# Patient Record
Sex: Male | Born: 1975 | Race: White | Hispanic: No | Marital: Married
Health system: Southern US, Community
[De-identification: ages and names within clinical notes are randomized; demographics above are authoritative.]

## PROBLEM LIST (undated history)

## (undated) DIAGNOSIS — L700 Acne vulgaris: Secondary | ICD-10-CM

## (undated) HISTORY — DX: Acne vulgaris: L70.0

---

## 2005-01-28 ENCOUNTER — Emergency Department (HOSPITAL_COMMUNITY): Admission: EM | Admit: 2005-01-28 | Discharge: 2005-01-28 | Payer: Self-pay | Admitting: Emergency Medicine

## 2005-01-30 ENCOUNTER — Ambulatory Visit (HOSPITAL_COMMUNITY): Admission: RE | Admit: 2005-01-30 | Discharge: 2005-01-30 | Payer: Self-pay | Admitting: Orthopedic Surgery

## 2005-07-02 ENCOUNTER — Emergency Department (HOSPITAL_COMMUNITY): Admission: EM | Admit: 2005-07-02 | Discharge: 2005-07-02 | Payer: Self-pay | Admitting: Family Medicine

## 2009-11-15 ENCOUNTER — Inpatient Hospital Stay (HOSPITAL_COMMUNITY): Admission: EM | Admit: 2009-11-15 | Discharge: 2009-11-17 | Payer: Self-pay | Admitting: Emergency Medicine

## 2011-02-22 LAB — ETHANOL: Alcohol, Ethyl (B): <5 mg/dL (ref 0–10)

## 2011-02-22 LAB — BASIC METABOLIC PANEL
BUN: 11 mg/dL (ref 6–23)
CO2: 25 mEq/L (ref 19–32)
Calcium: 8.9 mg/dL (ref 8.4–10.5)
Glucose, Bld: 93 mg/dL (ref 70–99)
Sodium: 138 mEq/L (ref 135–145)

## 2011-02-22 LAB — DIFFERENTIAL
Basophils Absolute: 0 10*3/uL (ref 0.0–0.1)
Eosinophils Relative: 1 % (ref 0–5)
Lymphocytes Relative: 34 % (ref 12–46)
Lymphs Abs: 3.7 10*3/uL (ref 0.7–4.0)
Neutro Abs: 6.4 10*3/uL (ref 1.7–7.7)
Neutrophils Relative %: 59 % (ref 43–77)

## 2011-02-22 LAB — CBC
Hemoglobin: 13.2 g/dL (ref 13.0–17.0)
MCHC: 34.2 g/dL (ref 30.0–36.0)
Platelets: 169 10*3/uL (ref 150–400)
Platelets: 212 10*3/uL (ref 150–400)
RDW: 12.4 % (ref 11.5–15.5)
RDW: 12.6 % (ref 11.5–15.5)
WBC: 10.9 10*3/uL — ABNORMAL HIGH (ref 4.0–10.5)

## 2011-02-22 LAB — TYPE AND SCREEN
ABO/RH(D): O NEG
Antibody Screen: NEGATIVE

## 2011-02-22 LAB — PROTIME-INR
INR: 1.04 (ref 0.00–1.49)
Prothrombin Time: 13.5 seconds (ref 11.6–15.2)

## 2011-02-22 LAB — HEMOGLOBIN AND HEMATOCRIT, BLOOD
HCT: 36.3 % — ABNORMAL LOW (ref 39.0–52.0)
HCT: 36.7 % — ABNORMAL LOW (ref 39.0–52.0)

## 2011-04-09 NOTE — Consult Note (Signed)
NAMEMAREON, ROBINETTE                ACCOUNT NO.:  [id number]   MEDICAL RECORD NO.:  [id number]          PATIENT TYPE:  EMS   LOCATION:  MAJO                         FACILITY:  [facility]   PHYSICIAN:  Dionne Ano. Gramig III, M.D.DATE OF BIRTH:  11-Aug-1976   DATE OF CONSULTATION:  01/28/2005  DATE OF DISCHARGE:                                   CONSULTATION   REFERRING PHYSICIAN:  Dr. Joen Laura.   REASON FOR CONSULTATION:  I had the pleasure to see Princella Pellegrini today in  the [facility] Emergency Room following the kind referral from Dr. Joen Laura.  This patient was injured on the job when a table saw injured his  left middle finger.  He has a partial amputation about the middle finger.  He denies other injury today.   ALLERGIES:  None.   MEDICINES:  None.   PAST MEDICAL HISTORY:  None.   PAST SURGICAL HISTORY:  None.   SOCIAL HISTORY:  He does not smoke or drink.  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   PHYSICAL EXAMINATION:  GENERAL:  He is alert and oriented.  EXTREMITIES:  On examination, the patient has a left middle finger partial  amputation.  There is no gross bony exposure.  There are no signs of  infection or dystrophy.  He has no obvious tendon injury.  His nail bed is  intact and nail plate is intact.  I reviewed this at length and his  findings.   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   PLAN:  I have gone ahead and performed I&D.  He was under a block anesthetic  and this area was probed.  There was no gross bony periosteum exposed and  thus we I&D'ed the skin and subcutaneous tissue, placed a sterile dressing  on this and following this, I bandaged him.  I have discharged him on Keflex  500 mg one p.o. daily.  I have given him a tetanus shot, 1 g of Ancef IM and  in addition to that, I discussed with him Keflex 500 mg 4 times  daily,  elevation and other precautions.  We will be calling him to set up elective  skin grafting and I&D at 36-48 hours from today's date.  His n.p.o. status  is unfavorable for operative intervention at present time.  I have discussed  __________ , etc.  He will be out of work until further notice.  All  questions have been encouraged and answered.     WMG/MEDQ  D:  01/28/2005  T:  01/29/2005  Job:  [id number]

## 2011-04-09 NOTE — Op Note (Signed)
NAMEFISHER, Aaron [name]                ACCOUNT NO.:  [id number]   MEDICAL RECORD NO.:  [id number]          PATIENT TYPE:  OIB   LOCATION:  2899                         FACILITY:  [facility]   PHYSICIAN:  Dionne Ano. Gramig III, M.D.DATE OF BIRTH:  1976-02-21   DATE OF PROCEDURE:  01/30/2005  DATE OF DISCHARGE:  01/30/2005                                 OPERATIVE REPORT   PREOPERATIVE DIAGNOSIS:  Partial amputation of left middle finger with  exposed subcu and a 4 x 2 cm loss of tissue.   POSTOPERATIVE DIAGNOSIS:  Partial amputation of left middle finger with  exposed subcu and a 4 x 2 cm loss of tissue.   PROCEDURES:  1.  I&D of left middle finger skin and subcutaneous tissue in preparation      for a skin grafting, left middle finger.  2.  Full thickness skin graft, 4 x 2 cm, left middle finger volar defect;      harvest site from the left forearm.  3.  Stress radiography.   SURGEON:  Dionne Ano. Amanda Pea, MD.   ASSISTANT:  None.   COMPLICATIONS:  None.   ANESTHESIA:  General.   INDICATIONS FOR PROCEDURE:  The patient is a 35 year old male, who sustained  an on-the-job injury.  He previously had I&D 36 hours and presents today for  definitive care.  I have counseled him in regards to risks and benefits of  surgery and he desires to proceed with the above-mentioned operative  intervention.  All questions have been encouraged and answered.   OPERATION IN DETAIL:  The patient was taken to the operative suite and  underwent a smooth induction of general anesthesia.  He was laid supine,  appropriately padded, and prepped and draped in the usual sterile fashion  with Betadine scrub and paint.  Following this and securing a sterile field,  the patient had the left middle finger prepared.  I prepared a skin grafting  site.  He had a stress radiography brought in, which revealed that there was  no exposed bone.  I placed this in the alignment of the distal phalanx.  Once stress radiography  was performed, I then performed I&D of skin and  subcutaneous tissue and prepared the area for skin grafting.  Greater than  three liters of solution were lavaged through the area to cleanse it.  Following this, I harvested a 4 x 2 cm full thickness skin graft from the  left volar forearm.  This was closed primarily with a Prolene suture.  Once  the full thickness skin graft was harvested, I defatted it and made a few  perforations in it, and then inset it into the  middle finger after  hemostasis was secured.  It was inset with a rim of 5-0 chromic and 4-0  Prolene sutures.  The 4-0 Prolene was tied over a bolster dressing.  Xeroform was placed followed by a cotton bolster dressing soaked in mineral  oil and saline.  This looked excellent and I was pleased with the findings  and the serrations in the skin graft allowed for brideability of  the  graft,in my opinion.  Following this, Marcaine was placed in the finger for  postop analgesia, approximately 8 mL, and the patient had a field block  placed about the volar forearm for purposes of analgesia postoperatively.  I  was pleased with this and the findings.  I then performed a cleansing of the  wound with water.  A sterile dressing and a long, volar, plaster splint was  applied without difficulty.  All sponge and instrument counts were reported  as correct.  He was taken to the recovery room and given an additional gram  of Ancef.  He will be monitored there and discharged home on Ultracet for  pain and continued antibiotics, which he has previously been on.  I have  discussed with him and his family __________, etc.  I will see him back in  the office in eight days.  All questions have been encouraged and answered.      WMG/MEDQ  D:  01/30/2005  T:  01/31/2005  Job:  [id number]

## 2012-08-30 ENCOUNTER — Other Ambulatory Visit: Payer: Self-pay | Admitting: Emergency Medicine

## 2012-08-30 NOTE — Telephone Encounter (Signed)
Chart pulled to PA pool at nurses station DOS 08/28/11

## 2012-09-07 ENCOUNTER — Ambulatory Visit (INDEPENDENT_AMBULATORY_CARE_PROVIDER_SITE_OTHER): Payer: BC Managed Care – PPO | Admitting: Physician Assistant

## 2012-09-07 VITALS — BP 118/88 | HR 72 | Temp 98.0°F | Resp 16 | Ht 73.25 in | Wt 198.2 lb

## 2012-09-07 DIAGNOSIS — L989 Disorder of the skin and subcutaneous tissue, unspecified: Secondary | ICD-10-CM

## 2012-09-07 DIAGNOSIS — B353 Tinea pedis: Secondary | ICD-10-CM

## 2012-09-07 LAB — POCT SKIN KOH: Skin KOH, POC: POSITIVE

## 2012-09-07 MED ORDER — KETOCONAZOLE 2 % EX CREA: TOPICAL_CREAM | Freq: Every day | CUTANEOUS | Status: DC

## 2012-09-07 NOTE — Progress Notes (Signed)
  Subjective:    Patient ID: Aaron [name], male    DOB: 04-Dec-1975, 36 y.o.   MRN: [id number]  HPI  Aaron [name] is a 36 yr old male with complaints of a one week history of a lesion on the third toe of the left foot.  There is some scaling and redness around the toe nail.  It is not painful, but is somewhat itchy.  He has not injured the toe that he is aware of.  No other systemic symptoms.    Review of Systems  All other systems reviewed and are negative.       Objective:   Physical Exam  Constitutional: He is oriented to person, place, and time. He appears well-developed and well-nourished. No distress.  Neurological: He is alert and oriented to person, place, and time.  Skin: Skin is warm and dry.       Lesion of the third digit of the left foot; advancing erythematous border with some scaling; lesion surrounds the nail but the nail appears unaffected   Filed Vitals:   09/07/12 1036  BP: 118/88  Pulse: 72  Temp: 98 F (36.7 C)  Resp: 16    Results for orders placed in visit on 09/07/12  POCT SKIN KOH      Component Value Range   Skin KOH, POC Positive            Assessment & Plan:   1. Tinea pedis  ketoconazole (NIZORAL) 2 % cream  2. Skin lesion of left lower limb  POCT Skin KOH   Aaron [name] is a 36 yr old male with tinea pedis.  Will treat with ketoconazole cream.  He will RTC if worsening or not improving.   Agree with the above assessment and plan.  Eula Listen, PA-C 09/07/2012 11:48 AM

## 2012-10-03 ENCOUNTER — Encounter: Payer: Self-pay | Admitting: Medical

## 2012-10-03 ENCOUNTER — Ambulatory Visit (INDEPENDENT_AMBULATORY_CARE_PROVIDER_SITE_OTHER): Payer: BC Managed Care – PPO | Admitting: Medical

## 2012-10-03 VITALS — BP 102/80 | HR 78 | Temp 97.8°F | Resp 16 | Ht 73.5 in | Wt 194.0 lb

## 2012-10-03 DIAGNOSIS — L918 Other hypertrophic disorders of the skin: Secondary | ICD-10-CM

## 2012-10-03 DIAGNOSIS — L909 Atrophic disorder of skin, unspecified: Secondary | ICD-10-CM

## 2012-10-03 DIAGNOSIS — R21 Rash and other nonspecific skin eruption: Secondary | ICD-10-CM

## 2012-10-03 MED ORDER — CLOTRIMAZOLE-BETAMETHASONE 1-0.05 % EX CREA: TOPICAL_CREAM | Freq: Two times a day (BID) | CUTANEOUS | Status: DC

## 2012-10-03 NOTE — Progress Notes (Signed)
Subjective: Here as a new patient today.  Here for c/o rash under both arms, worse L>R.  Had rash of toe recently, went to urgent care, was prescribed antifungal cream which cleared up the toenail rash, but 2 wk ago started getting rash under both arms, quite itchy.  Denies new deodorant, denies new cologne.  No prior similar.  No contacts with similar . No hx/o eczema or psoriasis.  He used the antifungal cream for 5 days, then used OTC hydrocortisone cream about 4 days.  Both creams helped the itch, but neither cleared this up completely.  He does see Dr. Cephus Slater dermatology for cystic acne.    Past Medical History  Diagnosis Date  . Cystic acne    ROS as in HPI  Objective: Gen: wd, wn, nad Skin: left axilla with 4-5 patches of roundish erythematous lesions, 2-3cm diameter each with somewhat raised border, slightly clearing center.  No scaling.  Right axilla with similar but smaller less defined lesions.   Left upper lateral chest wall with pedunculated 8mm x 2mm flap like lesion, skin tag vs verrucal lesion, pink with some erythema of base  Assessment: Encounter Diagnoses  Name Primary?  . Rash Yes  . Skin tag    Plan: Rash - suggestive or ringworm.  KOH prep with minimal hyphae.  Trial of Lotrisone cream.  If not improving in 1wk, consider biopsy.  Skin tag - if this bothers him or c/t to change colors/appear inflamed, then return for excision.  He declines flu vaccine today.

## 2012-11-23 ENCOUNTER — Encounter: Payer: Self-pay | Admitting: Family Medicine

## 2012-11-23 ENCOUNTER — Ambulatory Visit (INDEPENDENT_AMBULATORY_CARE_PROVIDER_SITE_OTHER): Payer: BC Managed Care – PPO | Admitting: Family Medicine

## 2012-11-23 VITALS — BP 130/90 | HR 88 | Wt 190.0 lb

## 2012-11-23 DIAGNOSIS — L708 Other acne: Secondary | ICD-10-CM

## 2012-11-23 DIAGNOSIS — L700 Acne vulgaris: Secondary | ICD-10-CM | POA: Insufficient documentation

## 2012-11-23 DIAGNOSIS — B009 Herpesviral infection, unspecified: Secondary | ICD-10-CM

## 2012-11-23 DIAGNOSIS — B001 Herpesviral vesicular dermatitis: Secondary | ICD-10-CM

## 2012-11-23 MED ORDER — MINOCYCLINE HCL 50 MG PO CAPS: 50.0000 mg | ORAL_CAPSULE | Freq: Two times a day (BID) | ORAL | Status: DC

## 2012-11-23 MED ORDER — VALACYCLOVIR HCL 1 G PO TABS: ORAL_TABLET | ORAL | Status: DC

## 2012-11-23 NOTE — Progress Notes (Signed)
  Subjective:    Patient ID: Aaron [name], male    DOB: 1976/07/18, 37 y.o.   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 He does respond quite nicely to Valtrex. He would like a renewal in his medication. He also has cystic acne and has been seen by Dr. Jorja Loa in the past. He is doing quite nicely on minocycline and would like this refilled.   Review of Systems     Objective:   Physical Exam Alert and in no distress. Exam of his face does show scarring from acne. He also has a healing lesion present on his mid lower lip.      Assessment & Plan:   1. Herpes labialis  valACYclovir (VALTREX) 1000 MG tablet  2. Acne cystica  minocycline (MINOCIN,DYNACIN) 50 MG capsule   discussed they'll checks for prevention however he would prefer to treat this episodically.

## 2013-01-11 ENCOUNTER — Encounter: Payer: Self-pay | Admitting: Family Medicine

## 2013-01-11 ENCOUNTER — Ambulatory Visit (INDEPENDENT_AMBULATORY_CARE_PROVIDER_SITE_OTHER): Payer: BC Managed Care – PPO | Admitting: Family Medicine

## 2013-01-11 VITALS — BP 120/80 | HR 80 | Wt 195.0 lb

## 2013-01-11 DIAGNOSIS — M549 Dorsalgia, unspecified: Secondary | ICD-10-CM

## 2013-01-11 MED ORDER — HYDROCODONE-ACETAMINOPHEN 5-500 MG PO TABS: 1.0000 | ORAL_TABLET | Freq: Three times a day (TID) | ORAL | Status: DC | PRN

## 2013-01-11 NOTE — Patient Instructions (Signed)
800 mg of Advil 3 times per day. Heat to your back for 20 minutes 3 times per day. Use pain medicine mainly at night she can't use it while driving or working. I do not want you on bedrest . Move around as much as you can

## 2013-01-11 NOTE — Progress Notes (Signed)
  Subjective:    Patient ID: Aaron [name], male    DOB: 1976/02/03, 37 y.o.   MRN: [id number]  HPI He complains of a five-day history of aching sensation in his back. No numbness, tingling or weakness but he does have some radiation down his right leg. No bowel or bladder trouble. He did see a chiropractor but states he is not getting any better. He has tried some OTC pain meds without much success.   Review of Systems     Objective:   Physical Exam Exam of his back shows normal lumbar curve with good motion. No tenderness palpation. Pain on forward flexion. Strength is normal. DTRs normal. Straight leg raising positive at 45 however sciatic stretch was negative.       Assessment & Plan:  Back pain - Plan: HYDROcodone-acetaminophen (VICODIN) 5-500 MG per tablet is to use heat 20 minutes 3 times per day. Also recommend 800 mg ibuprofen 3 times per day. Recheck here in one week.

## 2013-01-17 ENCOUNTER — Ambulatory Visit: Payer: BC Managed Care – PPO | Admitting: Family Medicine

## 2013-08-25 ENCOUNTER — Ambulatory Visit (INDEPENDENT_AMBULATORY_CARE_PROVIDER_SITE_OTHER): Payer: BC Managed Care – PPO | Admitting: Family Medicine

## 2013-08-25 ENCOUNTER — Ambulatory Visit: Payer: BC Managed Care – PPO

## 2013-08-25 VITALS — BP 122/78 | HR 84 | Temp 98.6°F | Resp 16 | Ht 75.0 in | Wt 177.0 lb

## 2013-08-25 DIAGNOSIS — R079 Chest pain, unspecified: Secondary | ICD-10-CM

## 2013-08-25 DIAGNOSIS — S20211A Contusion of right front wall of thorax, initial encounter: Secondary | ICD-10-CM

## 2013-08-25 DIAGNOSIS — R0781 Pleurodynia: Secondary | ICD-10-CM

## 2013-08-25 DIAGNOSIS — H1130 Conjunctival hemorrhage, unspecified eye: Secondary | ICD-10-CM

## 2013-08-25 DIAGNOSIS — S20219A Contusion of unspecified front wall of thorax, initial encounter: Secondary | ICD-10-CM

## 2013-08-25 DIAGNOSIS — H1132 Conjunctival hemorrhage, left eye: Secondary | ICD-10-CM

## 2013-08-25 MED ORDER — HYDROCODONE-ACETAMINOPHEN 5-325 MG PO TABS: 1.0000 | ORAL_TABLET | Freq: Four times a day (QID) | ORAL | Status: DC | PRN
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
## 2013-08-25 NOTE — Patient Instructions (Signed)
Ibuprofen up to 800mg  every 8 hours as needed with food during the day, can take hydrocodone every 6 hours as needed for pain.  Deep breaths throughout the day. Return to the clinic or go to the nearest emergency room if any of your symptoms worsen or new symptoms occur.   Subconjunctival Hemorrhage A subconjunctival hemorrhage is a bright red patch covering a portion of the white of the eye. The white part of the eye is called the sclera, and it is covered by a thin membrane called the conjunctiva. This membrane is clear, except for tiny blood vessels that you can see with the naked eye. When your eye is irritated or inflamed and becomes red, it is because the vessels in the conjunctiva are swollen. Sometimes, a blood vessel in the conjunctiva can break and bleed. When this occurs, the blood builds up between the conjunctiva and the sclera, and spreads out to create a red area. The red spot may be very small at first. It may then spread to cover a larger part of the surface of the eye, or even all of the visible white part of the eye. In almost all cases, the blood will go away and the eye will become white again. Before completely dissolving, however, the red area may spread. It may also become brownish-yellow in color, before going away. If a lot of blood collects under the conjunctiva, it may look like a bulge on the surface of the eye. This looks scary, but it will also eventually flatten out and go away. Subconjunctival hemorrhages do not cause pain, but if swollen, may cause a feeling of irritation. There is no effect on vision.  CAUSES   The most common cause is mild trauma (rubbing the eye, irritation).  Subconjunctival hemorrhages can happen because of coughing or straining (lifting heavy objects), vomiting, or sneezing.  In some cases, your doctor may want to check your blood pressure. High blood pressure can also cause a sunconjunctival hemorrhage.  Severe trauma or blunt  injuries.  Diseases that affect blood clotting (hemophilia, leukemia).  Abnormalities of blood vessels behind the eye (carotid cavernous sinus fistula).  Tumors behind the eye.  Certain drugs (aspirin, coumadin, heparin).  Recent eye surgery. HOME CARE INSTRUCTIONS   Do not worry about the appearance of your eye. You may continue your usual activities.  Often, follow-up is not necessary. SEEK MEDICAL CARE IF:   Your eye becomes painful.  The bleeding does not disappear within 3 weeks.  Bleeding occurs elsewhere, for example, under the skin, in the mouth, or in the other eye.  You have recurring subconjunctival hemorrhages. SEEK IMMEDIATE MEDICAL CARE IF:   Your vision changes or you have difficulty seeing.  You develop severe headache, persistent vomiting, confusion, or abnormal drowsiness (lethargy).  Your eye seems to bulge or protrude from the eye socket.  You notice the sudden appearance of bruises, or have spontaneous bleeding elsewhere on your body. Document Released: 11/08/2005 Document Revised: 01/31/2012 Document Reviewed: 10/06/2009 [facility] Patient Information 2014 [place], [place].   Rib Contusion A rib contusion (bruise) can occur by a blow to the chest or by a fall against a hard object. Usually these will be much better in a couple weeks. If X-rays were taken today and there are no broken bones (fractures), the diagnosis of bruising is made. However, broken ribs may not show up for several days, or may be discovered later on a routine X-ray when signs of healing show up. If this happens  to you, it does not mean that something was missed on the X-ray, but simply that it did not show up on the first X-rays. Earlier diagnosis will not usually change the treatment. HOME CARE INSTRUCTIONS   Avoid strenuous activity. Be careful during activities and avoid bumping the injured ribs. Activities that pull on the injured ribs and cause pain should be avoided, if  possible.  For the first day or two, an ice pack used every 20 minutes while awake may be helpful. Put ice in a plastic bag and put a towel between the bag and the skin.  Eat a normal, well-balanced diet. Drink plenty of fluids to avoid constipation.  Take deep breaths several times a day to keep lungs free of infection. Try to cough several times a day. Splint the injured area with a pillow while coughing to ease pain. Coughing can help prevent pneumonia.  Wear a rib belt or binder only if told to do so by your caregiver. If you are wearing a rib belt or binder, you must do the breathing exercises as directed by your caregiver. If not used properly, rib belts or binders restrict breathing which can lead to pneumonia.  Only take over-the-counter or prescription medicines for pain, discomfort, or fever as directed by your caregiver. SEEK MEDICAL CARE IF:   You or your child has an oral temperature above 102 F (38.9 C).  Your baby is older than 3 months with a rectal temperature of 100.5 F (38.1 C) or higher for more than 1 day.  You develop a cough, with thick or bloody sputum. SEEK IMMEDIATE MEDICAL CARE IF:   You have difficulty breathing.  You feel sick to your stomach (nausea), have vomiting or belly (abdominal) pain.  You have worsening pain, not controlled with medications, or there is a change in the location of the pain.  You develop sweating or radiation of the pain into the arms, jaw or shoulders, or become light headed or faint.  You or your child has an oral temperature above 102 F (38.9 C), not controlled by medicine.  Your or your baby is older than 3 months with a rectal temperature of 102 F (38.9 C) or higher.  Your baby is 64 months old or younger with a rectal temperature of 100.4 F (38 C) or higher. MAKE SURE YOU:   Understand these instructions.  Will watch your condition.  Will get help right away if you are not doing well or get worse. Document  Released: 08/03/2001 Document Revised: 01/31/2012 Document Reviewed: 06/26/2008 [facility] Patient Information 2014 [place], [place].

## 2013-09-28 ENCOUNTER — Ambulatory Visit: Payer: BC Managed Care – PPO | Admitting: Medical

## 2013-10-14 ENCOUNTER — Other Ambulatory Visit: Payer: Self-pay | Admitting: Family Medicine

## 2013-10-19 ENCOUNTER — Ambulatory Visit (INDEPENDENT_AMBULATORY_CARE_PROVIDER_SITE_OTHER): Payer: BC Managed Care – PPO | Admitting: Internal Medicine

## 2013-10-19 ENCOUNTER — Ambulatory Visit: Payer: BC Managed Care – PPO

## 2013-10-19 VITALS — BP 136/88 | HR 92 | Temp 98.9°F | Resp 18 | Ht 74.0 in | Wt 186.4 lb

## 2013-10-19 DIAGNOSIS — S0125XA Open bite of nose, initial encounter: Secondary | ICD-10-CM

## 2013-10-19 DIAGNOSIS — S0120XA Unspecified open wound of nose, initial encounter: Secondary | ICD-10-CM

## 2013-10-19 DIAGNOSIS — L0291 Cutaneous abscess, unspecified: Secondary | ICD-10-CM

## 2013-10-19 DIAGNOSIS — L0390 Cellulitis, unspecified: Secondary | ICD-10-CM

## 2013-10-19 DIAGNOSIS — M79609 Pain in unspecified limb: Secondary | ICD-10-CM

## 2013-10-19 DIAGNOSIS — M79641 Pain in right hand: Secondary | ICD-10-CM

## 2013-10-19 MED ORDER — AMOXICILLIN-POT CLAVULANATE 875-125 MG PO TABS: 1.0000 | ORAL_TABLET | Freq: Two times a day (BID) | ORAL | Status: DC

## 2013-10-19 MED ORDER — MUPIROCIN 2 % EX OINT: 1.0000 "application " | TOPICAL_OINTMENT | Freq: Three times a day (TID) | CUTANEOUS | Status: DC

## 2013-10-19 MED ORDER — CEFTRIAXONE SODIUM 1 G IJ SOLR
2.0000 g | Freq: Once | INTRAMUSCULAR | Status: AC
  Administered 2013-10-19: 2 g via INTRAMUSCULAR

## 2013-10-19 MED ORDER — HYDROCODONE-ACETAMINOPHEN 5-325 MG PO TABS: 1.0000 | ORAL_TABLET | Freq: Four times a day (QID) | ORAL | Status: DC | PRN

## 2013-10-19 NOTE — Progress Notes (Signed)
   Subjective:    Patient ID: Aaron [name], male    DOB: 02-04-1976, 37 y.o.   MRN: [id number]  HPI 37 year old man presents to clinic today with a dog bite to his right hand. He has 3 puncture wounds. Pt states his hand is throbbing and it was hard to sleep. He kept ice on it overnight. He has not taken any OTC meds for this. Pt states he has had a tetanus shot within the last year. Pt states he was bitten by his neighbor's dog last night. His dog and neighbor's dog were at a shared fence. Pt went to go get his dog and pick her up- as he did that he was bitten by the neighbor's dog. Status of dog being up to date with rabies shot is unknown. Pt did not have this conversation with the owner last night.  Gave Pt ibuprofen 600mg  PO at 12:35pm in the office for hand pain.- per Dr. Perrin Maltese. Eileen Stanford F. Bite over thumb,[facility]st [facility] and palm Review of Systems     Objective:   Physical Exam  Constitutional: He is oriented to person, place, and time. He appears well-developed and well-nourished.  HENT:  Head: Normocephalic.  Eyes: EOM are normal. No scleral icterus.  Neck: Normal range of motion.  Pulmonary/Chest: Effort normal.  Musculoskeletal: He exhibits edema and tenderness.       Left hand: He exhibits decreased range of motion, tenderness, laceration and swelling. He exhibits no bony tenderness and normal two-point discrimination. Normal sensation noted. Decreased strength noted. He exhibits thumb/finger opposition.       Hands: 3 puncture wounds  Neurological: He is alert and oriented to person, place, and time. No cranial nerve deficit. He exhibits normal muscle tone. Coordination normal.  Skin: Laceration noted. There is erythema.     Thenar eminence is red and swollen Painfull to move thumb any direction.  Psychiatric: He has a normal mood and affect.   Red swollen painfull thumb, thenar eminence. Bite wounds clearly visible. NMVS intact  [facility] reading (PRIMARY) by  Dr Malissie Musgrave no fx seen,  no fb seen        Assessment & Plan:  2 g rocephin IM Augmentin 875mg  bid/Vicodin Augmentin 875mg  bid/vicodin 5/325 prn Wound care and rest/elevate

## 2013-10-19 NOTE — Progress Notes (Signed)
Gave Pt 1 gram Rocephin in each glute muscle. (total of 2 grams Rocephin) Cassie Freer

## 2013-10-19 NOTE — Patient Instructions (Signed)
Cellulitis Cellulitis is an infection of the skin and the tissue beneath it. The infected area is usually red and tender. Cellulitis occurs most often in the arms and lower legs.  CAUSES  Cellulitis is caused by bacteria that enter the skin through cracks or cuts in the skin. The most common types of bacteria that cause cellulitis are Staphylococcus and Streptococcus. SYMPTOMS   Redness and warmth.  Swelling.  Tenderness or pain.  Fever. DIAGNOSIS  Your caregiver can usually determine what is wrong based on a physical exam. Blood tests may also be done. TREATMENT  Treatment usually involves taking an antibiotic medicine. HOME CARE INSTRUCTIONS   Take your antibiotics as directed. Finish them even if you start to feel better.  Keep the infected arm or leg elevated to reduce swelling.  Apply a warm cloth to the affected area up to 4 times per day to relieve pain.  Only take over-the-counter or prescription medicines for pain, discomfort, or fever as directed by your caregiver.  Keep all follow-up appointments as directed by your caregiver. SEEK MEDICAL CARE IF:   You notice red streaks coming from the infected area.  Your red area gets larger or turns dark in color.  Your bone or joint underneath the infected area becomes painful after the skin has healed.  Your infection returns in the same area or another area.  You notice a swollen bump in the infected area.  You develop new symptoms. SEEK IMMEDIATE MEDICAL CARE IF:   You have a fever.  You feel very sleepy.  You develop vomiting or diarrhea.  You have a general ill feeling (malaise) with muscle aches and pains. MAKE SURE YOU:   Understand these instructions.  Will watch your condition.  Will get help right away if you are not doing well or get worse. Document Released: 08/18/2005 Document Revised: 05/09/2012 Document Reviewed: 01/24/2012 [facility] Patient Information 2014 [place], [place]. Animal Bite An  animal bite can result in a scratch on the skin, deep open cut, puncture of the skin, crush injury, or tearing away of the skin or a body part. Dogs are responsible for most animal bites. Children are bitten more often than adults. An animal bite can range from very mild to more serious. A small bite from your house pet is no cause for alarm. However, some animal bites can become infected or injure a bone or other tissue. You must seek medical care if:  The skin is broken and bleeding does not slow down or stop after 15 minutes.  The puncture is deep and difficult to clean (such as a cat bite).  Pain, warmth, redness, or pus develops around the wound.  The bite is from a stray animal or rodent. There may be a risk of rabies infection.  The bite is from a snake, raccoon, skunk, fox, coyote, or bat. There may be a risk of rabies infection.  The person bitten has a chronic illness such as diabetes, liver disease, or cancer, or the person takes medicine that lowers the immune system.  There is concern about the location and severity of the bite. It is important to clean and protect an animal bite wound right away to prevent infection. Follow these steps:  Clean the wound with plenty of water and soap.  Apply an antibiotic cream.  Apply gentle pressure over the wound with a clean towel or gauze to slow or stop bleeding.  Elevate the affected area above the heart to help stop any bleeding.  Seek medical care. Getting medical care within 8 hours of the animal bite leads to the best possible outcome. DIAGNOSIS  Your caregiver will most likely:  Take a detailed history of the animal and the bite injury.  Perform a wound exam.  Take your medical history. Blood tests or X-rays may be performed. Sometimes, infected bite wounds are cultured and sent to a lab to identify the infectious bacteria.  TREATMENT  Medical treatment will depend on the location and type of animal bite as well as the  patient's medical history. Treatment may include:  Wound care, such as cleaning and flushing the wound with saline solution, bandaging, and elevating the affected area.  Antibiotics.  Tetanus immunization.  Rabies immunization.  Leaving the wound open to heal. This is often done with animal bites, due to the high risk of infection. However, in certain cases, wound closure with stitches, wound adhesive, skin adhesive strips, or staples may be used. Infected bites that are left untreated may require intravenous (IV) antibiotics and surgical treatment in the hospital. HOME CARE INSTRUCTIONS  Follow your caregiver's instructions for wound care.  Take all medicines as directed.  If your caregiver prescribes antibiotics, take them as directed. Finish them even if you start to feel better.  Follow up with your caregiver for further exams or immunizations as directed. You may need a tetanus shot if:  You cannot remember when you had your last tetanus shot.  You have never had a tetanus shot.  The injury broke your skin. If you get a tetanus shot, your arm may swell, get red, and feel warm to the touch. This is common and not a problem. If you need a tetanus shot and you choose not to have one, there is a rare chance of getting tetanus. Sickness from tetanus can be serious. SEEK MEDICAL CARE IF:  You notice warmth, redness, soreness, swelling, pus discharge, or a bad smell coming from the wound.  You have a red line on the skin coming from the wound.  You have a fever, chills, or a general ill feeling.  You have nausea or vomiting.  You have continued or worsening pain.  You have trouble moving the injured part.  You have other questions or concerns. MAKE SURE YOU:  Understand these instructions.  Will watch your condition.  Will get help right away if you are not doing well or get worse. Document Released: 07/27/2011 Document Revised: 01/31/2012 Document Reviewed:  07/27/2011 [facility] Patient Information 2014 [place], [place].

## 2013-10-21 ENCOUNTER — Ambulatory Visit (INDEPENDENT_AMBULATORY_CARE_PROVIDER_SITE_OTHER): Payer: BC Managed Care – PPO | Admitting: Emergency Medicine

## 2013-10-21 VITALS — BP 124/80 | HR 82 | Temp 98.5°F | Resp 18 | Ht 74.0 in | Wt 187.0 lb

## 2013-10-21 DIAGNOSIS — W540XXD Bitten by dog, subsequent encounter: Secondary | ICD-10-CM

## 2013-10-21 DIAGNOSIS — T148XXA Other injury of unspecified body region, initial encounter: Secondary | ICD-10-CM

## 2013-10-21 NOTE — Progress Notes (Signed)
Subjective:  This chart was scribed for Aaron Chris, MD by Carl Best, Medical Scribe. This patient was seen in Room 4 and the patient's care was started at 11:11 AM.   Patient ID: Aaron [name], male    DOB: [birth year], 37 y.o.   MRN: [id number]  HPI HPI Comments: Aaron [name] is a 37 y.o. male who presents to the [facility] needing a follow-up of his right hand injury after the patient was bitten by his neighbor's dog last Thursday night after he tried to retrieve a tennis ball from his neighbor's yard.  He states that he was unaware of the dog being present.  He states that he was seen at [facility] on Friday morning and was given two shots of Penicillin and prescribed antibiotics.  He states that he has taken his antibiotics regularly and applied the antibiotic cream to the right hand three times a day.  He states that he has noticed some ecchymosis to the area and the swelling has gone down.  He states that he is still in some pain but it is starting to subside.  He denies arm pain as an associated symptom.  The patient states that he has not heard from animal control.  He states that does not know if the dog has had his shots.  The patient states he works for [employer].    Past Medical History  Diagnosis Date  . Cystic acne    History reviewed. No pertinent past surgical history. History reviewed. No pertinent family history. History   Social History  . Marital Status: Married    Spouse Name: N/A    Number of Children: N/A  . Years of Education: N/A   Occupational History  . Not on file.   Social History Main Topics  . Smoking status: Current Every Day Smoker  . Smokeless tobacco: Not on file  . Alcohol Use: 3.0 oz/week    6 drink(s) per week     Comment: socially  . Drug Use: No  . Sexual Activity: Yes   Other Topics Concern  . Not on file   Social History Narrative  . No narrative on file   No Active Allergies  Current outpatient  prescriptions:amoxicillin-clavulanate (AUGMENTIN) 875-125 MG per tablet, Take 1 tablet by mouth 2 (two) times daily., Disp: 20 tablet, Rfl: 0;  HYDROcodone-acetaminophen (NORCO/VICODIN) 5-325 MG per tablet, Take 1 tablet by mouth every 6 (six) hours as needed., Disp: 30 tablet, Rfl: 0;  mupirocin ointment (BACTROBAN) 2 %, Apply 1 application topically 3 (three) times daily., Disp: 22 g, Rfl: 0 minocycline (MINOCIN,DYNACIN) 50 MG capsule, Take 1 capsule (50 mg total) by mouth 2 (two) times daily. Needs office visit, Disp: 180 capsule, Rfl: 3;  valACYclovir (VALTREX) 1000 MG tablet, TAKE 2 TABLETS BY MOUTH TWICE DAILY, Disp: 12 tablet, Rfl: 0   Review of Systems  Musculoskeletal: Negative for arthralgias.  Skin: Positive for wound (right hand).  All other systems reviewed and are negative.     Objective:  Physical Exam Physical Exam  Nursing note and vitals reviewed. Constitutional: He is oriented to person, place, and time. He appears well-developed and well-nourished. No distress.  HENT:  Head: Normocephalic.  Eyes: EOM are normal. Pupils are equal, round, and reactive to light.  Neck: Normal range of motion. Neck supple.  Cardiovascular: Normal rate, regular rhythm and normal heart sounds.  Exam reveals no gallop and no friction rub.   No murmur heard. Pulmonary/Chest: Effort normal  and breath sounds normal. No respiratory distress. He has no wheezes. He has no rales.  Abdominal: Soft.  Neurological: He is alert and oriented to person, place, and time.  Skin: Two puncture wounds over the thenar evidence each surrounded by 2 cm of redness.  Slight decreased flexion of the thumb.  There is no lymphangitis.        Assessment & Plan:  Dog bite is improving on Augmentin. He'll continue soap and water cleaning. He will call animal control and be sure they are evaluating the status of the dog's rabies. Recheck after he finishes all his antibiotics. I personally performed the services  described in this documentation, which was scribed in my presence. The recorded information has been reviewed and is accurate.

## 2013-11-30 ENCOUNTER — Encounter: Payer: Self-pay | Admitting: Family Medicine

## 2013-11-30 ENCOUNTER — Ambulatory Visit (INDEPENDENT_AMBULATORY_CARE_PROVIDER_SITE_OTHER): Payer: BC Managed Care – PPO | Admitting: Family Medicine

## 2013-11-30 VITALS — BP 122/90 | HR 75 | Wt 190.0 lb

## 2013-11-30 DIAGNOSIS — G4700 Insomnia, unspecified: Secondary | ICD-10-CM

## 2013-11-30 MED ORDER — ZOLPIDEM TARTRATE 10 MG PO TABS: 10.0000 mg | ORAL_TABLET | Freq: Every evening | ORAL | Status: DC | PRN

## 2013-11-30 NOTE — Patient Instructions (Addendum)

## 2013-11-30 NOTE — Progress Notes (Signed)
   Subjective:    Patient ID: [place] P[place] J Kohan, male    DOB: 07-Jun-1976, 38 y.o.   MRN: [id number]  HPI Approximately 2 months ago he broke up with his fiance. One month ago he quit smoking. In the last month and a half he's noted increased difficulty with sleep. He has tried alcohol for this. He had about a month ago. He recently started using Tylenol PM and is using 3 per night with no success . He is not on decongestants or other medications.   Review of Systems     Objective:   Physical Exam Alert and in no distress otherwise not examined       Assessment & Plan:  Insomnia - Plan: zolpidem (AMBIEN) 10 MG tablet  sleep hygiene information given to the patient. He encouraged him to follow the directions. Also recommended counseling to help deal with his breakup. Followup here when necessary.

## 2013-12-03 ENCOUNTER — Other Ambulatory Visit: Payer: Self-pay | Admitting: Family Medicine

## 2013-12-03 NOTE — Telephone Encounter (Signed)
Is this okay to refill? 

## 2013-12-06 ENCOUNTER — Ambulatory Visit (INDEPENDENT_AMBULATORY_CARE_PROVIDER_SITE_OTHER): Payer: BC Managed Care – PPO | Admitting: Family Medicine

## 2013-12-06 VITALS — BP 114/86 | HR 98 | Wt 188.0 lb

## 2013-12-06 DIAGNOSIS — J111 Influenza due to unidentified influenza virus with other respiratory manifestations: Secondary | ICD-10-CM

## 2013-12-06 DIAGNOSIS — R509 Fever, unspecified: Secondary | ICD-10-CM

## 2013-12-06 DIAGNOSIS — B009 Herpesviral infection, unspecified: Secondary | ICD-10-CM

## 2013-12-06 DIAGNOSIS — B001 Herpesviral vesicular dermatitis: Secondary | ICD-10-CM

## 2013-12-06 MED ORDER — BENZONATATE 100 MG PO CAPS: 100.0000 mg | ORAL_CAPSULE | Freq: Three times a day (TID) | ORAL | Status: DC | PRN

## 2013-12-06 MED ORDER — VALACYCLOVIR HCL 1 G PO TABS: ORAL_TABLET | ORAL | Status: DC

## 2013-12-06 NOTE — Progress Notes (Signed)
   Subjective:    Patient ID: Aaron [name], male    DOB: 08/30/1976, 38 y.o.   MRN: [id number]  HPI He has a one-day history of cough, fatigue, shortness of breath, myalgias, sore throat, fever and chills, nasal and chest congestion. No earache, nausea or vomiting. He also would like a refill on his Valtrex. He has had several more outbreaks.   Review of Systems     Objective:   Physical Exam alert and in no distress. Tympanic membranes and canals are normal. Throat is clear. Tonsils are normal. Neck is supple without adenopathy or thyromegaly. Cardiac exam shows a regular sinus rhythm without murmurs or gallops. Lungs are clear to auscultation.        Assessment & Plan:  Fever and chills - Plan: Influenza A/B, benzonatate (TESSALON) 100 MG capsule  Flu syndrome - Plan: benzonatate (TESSALON) 100 MG capsule  Herpes labialis - Plan: valACYclovir (VALTREX) 1000 MG tablet  information about the flu was given. He is to call if he has further difficulty with cough and congestion

## 2013-12-06 NOTE — Patient Instructions (Signed)

## 2013-12-07 LAB — POC INFLUENZA A&B (BINAX/QUICKVUE)

## 2013-12-07 NOTE — Addendum Note (Signed)
Addended by: Ronnald NianLALONDE, JOHN C on: 12/07/2013 02:42 PM   Modules accepted: Orders

## 2014-01-21 ENCOUNTER — Other Ambulatory Visit: Payer: Self-pay | Admitting: Family Medicine

## 2014-01-21 NOTE — Telephone Encounter (Signed)
Forwarding to you :)

## 2014-01-21 NOTE — Telephone Encounter (Signed)
Medication sent in. 

## 2014-05-16 ENCOUNTER — Emergency Department (HOSPITAL_COMMUNITY): Payer: BC Managed Care – PPO

## 2014-05-16 ENCOUNTER — Encounter (HOSPITAL_COMMUNITY): Payer: Self-pay | Admitting: Emergency Medicine

## 2014-05-16 ENCOUNTER — Inpatient Hospital Stay (HOSPITAL_COMMUNITY)
Admission: EM | Admit: 2014-05-16 | Discharge: 2014-05-19 | DRG: 378 | Disposition: A | Discharge status: Home / Self Care | Payer: BC Managed Care – PPO | Attending: Internal Medicine | Admitting: Internal Medicine

## 2014-05-16 DIAGNOSIS — B001 Herpesviral vesicular dermatitis: Secondary | ICD-10-CM

## 2014-05-16 DIAGNOSIS — Z87891 Personal history of nicotine dependence: Secondary | ICD-10-CM

## 2014-05-16 DIAGNOSIS — R197 Diarrhea, unspecified: Secondary | ICD-10-CM | POA: Diagnosis present

## 2014-05-16 DIAGNOSIS — K922 Gastrointestinal hemorrhage, unspecified: Principal | ICD-10-CM

## 2014-05-16 DIAGNOSIS — F1020 Alcohol dependence, uncomplicated: Secondary | ICD-10-CM

## 2014-05-16 DIAGNOSIS — K766 Portal hypertension: Secondary | ICD-10-CM | POA: Diagnosis present

## 2014-05-16 DIAGNOSIS — K219 Gastro-esophageal reflux disease without esophagitis: Secondary | ICD-10-CM | POA: Diagnosis present

## 2014-05-16 DIAGNOSIS — A0472 Enterocolitis due to Clostridium difficile, not specified as recurrent: Secondary | ICD-10-CM | POA: Diagnosis present

## 2014-05-16 DIAGNOSIS — L700 Acne vulgaris: Secondary | ICD-10-CM

## 2014-05-16 DIAGNOSIS — K319 Disease of stomach and duodenum, unspecified: Secondary | ICD-10-CM | POA: Diagnosis present

## 2014-05-16 LAB — CBC WITH DIFFERENTIAL/PLATELET
Basophils Absolute: 0 10*3/uL (ref 0.0–0.1)
Basophils Relative: 0 % (ref 0–1)
Eosinophils Absolute: 0.1 10*3/uL (ref 0.0–0.7)
Eosinophils Relative: 0 % (ref 0–5)
HEMATOCRIT: 47 % (ref 39.0–52.0)
HEMOGLOBIN: 16.3 g/dL (ref 13.0–17.0)
Lymphocytes Relative: 25 % (ref 12–46)
Lymphs Abs: 2.8 10*3/uL (ref 0.7–4.0)
MCH: 33.7 pg (ref 26.0–34.0)
MCHC: 34.7 g/dL (ref 30.0–36.0)
MCV: 97.3 fL (ref 78.0–100.0)
MONO ABS: 0.8 10*3/uL (ref 0.1–1.0)
MONOS PCT: 7 % (ref 3–12)
NEUTROS ABS: 7.7 10*3/uL (ref 1.7–7.7)
Neutrophils Relative %: 68 % (ref 43–77)
Platelets: 265 10*3/uL (ref 150–400)
RBC: 4.83 MIL/uL (ref 4.22–5.81)
RDW: 12.5 % (ref 11.5–15.5)
WBC: 11.4 10*3/uL — ABNORMAL HIGH (ref 4.0–10.5)

## 2014-05-16 LAB — COMPREHENSIVE METABOLIC PANEL
ALK PHOS: 68 U/L (ref 39–117)
ALT: 39 U/L (ref 0–53)
AST: 38 U/L — AB (ref 0–37)
Albumin: 4.3 g/dL (ref 3.5–5.2)
BILIRUBIN TOTAL: 0.5 mg/dL (ref 0.3–1.2)
BUN: 9 mg/dL (ref 6–23)
CHLORIDE: 96 meq/L (ref 96–112)
CO2: 22 mEq/L (ref 19–32)
CREATININE: 1 mg/dL (ref 0.50–1.35)
Calcium: 10 mg/dL (ref 8.4–10.5)
GFR calc Af Amer: >90 mL/min (ref >90)
GFR calc non Af Amer: >90 mL/min (ref >90)
Glucose, Bld: 115 mg/dL — ABNORMAL HIGH (ref 70–99)
Potassium: 3.9 mEq/L (ref 3.7–5.3)
Sodium: 141 mEq/L (ref 137–147)
Total Protein: 7.9 g/dL (ref 6.0–8.3)

## 2014-05-16 LAB — SAMPLE TO BLOOD BANK

## 2014-05-16 LAB — LIPASE, BLOOD: LIPASE: 36 U/L (ref 11–59)

## 2014-05-16 MED ORDER — IOHEXOL 300 MG/ML  SOLN
25.0000 mL | Freq: Once | INTRAMUSCULAR | Status: AC | PRN
  Administered 2014-05-16: 25 mL via ORAL

## 2014-05-16 MED ORDER — FAMOTIDINE IN NACL 20-0.9 MG/50ML-% IV SOLN
20.0000 mg | Freq: Once | INTRAVENOUS | Status: AC
  Administered 2014-05-16: 20 mg via INTRAVENOUS
  Filled 2014-05-16: qty 50

## 2014-05-16 MED ORDER — GI COCKTAIL ~~LOC~~
30.0000 mL | Freq: Once | ORAL | Status: AC
  Administered 2014-05-16: 30 mL via ORAL
  Filled 2014-05-16: qty 30

## 2014-05-16 MED ORDER — ONDANSETRON HCL 4 MG/2ML IJ SOLN
4.0000 mg | Freq: Once | INTRAMUSCULAR | Status: AC
  Administered 2014-05-16: 4 mg via INTRAVENOUS
  Filled 2014-05-16: qty 2

## 2014-05-16 MED ORDER — LORAZEPAM 2 MG/ML IJ SOLN
1.0000 mg | Freq: Once | INTRAMUSCULAR | Status: AC
  Administered 2014-05-16: 1 mg via INTRAVENOUS
  Filled 2014-05-16: qty 1

## 2014-05-16 MED ORDER — DIPHENHYDRAMINE HCL 50 MG/ML IJ SOLN
25.0000 mg | Freq: Once | INTRAMUSCULAR | Status: AC
  Administered 2014-05-16: 25 mg via INTRAVENOUS
  Filled 2014-05-16: qty 1

## 2014-05-16 MED ORDER — SODIUM CHLORIDE 0.9 % IV BOLUS (SEPSIS)
1000.0000 mL | Freq: Once | INTRAVENOUS | Status: AC
  Administered 2014-05-16: 1000 mL via INTRAVENOUS

## 2014-05-16 MED ORDER — PROCHLORPERAZINE EDISYLATE 5 MG/ML IJ SOLN
10.0000 mg | Freq: Once | INTRAMUSCULAR | Status: AC
  Administered 2014-05-16: 10 mg via INTRAVENOUS
  Filled 2014-05-16: qty 2

## 2014-05-16 NOTE — ED Notes (Signed)
PA at bedside.

## 2014-05-16 NOTE — ED Provider Notes (Signed)
CSN: [id number]     Arrival date & time 05/16/14  1905 History   First MD Initiated Contact with Patient 05/16/14 1945     Chief Complaint  Patient presents with  . Hematemesis     (Consider location/radiation/quality/duration/timing/severity/associated sxs/prior Treatment) The history is provided by the patient.    Aaron [name] is a(n) 38 y.o. male who presents to the ED for n/v/d and hematemesis. Onset 3 days ago, worsening. Unable to hold down foods or fluids. Today the l patient began having several episodes of vomiting bright red blood. He states that this has happened in the past. Patient had a previous admission for gross hematemesis and was found to have peptic ulcer disease and a Mallory-Weiss tear on an EGD. The patient states that he does drink a 12 pack of beer up to 3 times a week as well as a history of reflux for which he takes Prilosec. He says he feels been unable to hold any of his medications. He complains of severe burning pain in his esophagus and throat. Severe nausea. Watery diarrhea.  Past Medical History  Diagnosis Date  . Cystic acne    History reviewed. No pertinent past surgical history. No family history on file. History  Substance Use Topics  . Smoking status: Former Games developermoker  . Smokeless tobacco: Former NeurosurgeonUser     Comment: quit 3wks ago  . Alcohol Use: 3.0 oz/week    6 drink(s) per week     Comment: socially    Review of Systems  Ten systems reviewed and are negative for acute change, except as noted in the HPI.    Allergies  Review of patient's allergies indicates no known allergies.  Home Medications   Prior to Admission medications   Medication Sig Start Date End Date Taking? Authorizing Provider  minocycline (MINOCIN,DYNACIN) 50 MG capsule Take 50 mg by mouth 2 (two) times daily.   Yes Historical Provider, MD  omeprazole (PRILOSEC OTC) 20 MG tablet Take 20 mg by mouth daily.   Yes Historical Provider, MD  valACYclovir (VALTREX) 1000 MG  tablet Take 2,000 mg by mouth 2 (two) times daily as needed (cold sores).   Yes Historical Provider, MD   BP 139/93  Pulse 76  Temp(Src) 98.6 F (37 C) (Oral)  Resp 22  SpO2 100% Physical Exam  Nursing note and vitals reviewed. Constitutional: He is oriented to person, place, and time. He appears well-developed and well-nourished. No distress.  HENT:  Head: Normocephalic and atraumatic.  Eyes: Conjunctivae are normal. No scleral icterus.  Neck: Normal range of motion. Neck supple.  Cardiovascular: Regular rhythm and normal heart sounds.   tachycardic  Pulmonary/Chest: Effort normal and breath sounds normal. No respiratory distress.  Abdominal: Soft. He exhibits distension. There is tenderness.  Loud, painful sounding vomiting . approx 1 cup of bright red hematemesis.  Musculoskeletal: He exhibits no edema.  Neurological: He is alert and oriented to person, place, and time.  Skin: Skin is warm. He is diaphoretic.  Psychiatric: His behavior is normal.    ED Course  Procedures (including critical care time) Labs Review Labs Reviewed  CBC WITH DIFFERENTIAL - Abnormal; Notable for the following:    WBC 11.4 (*)    All other components within normal limits  COMPREHENSIVE METABOLIC PANEL - Abnormal; Notable for the following:    Glucose, Bld 115 (*)    AST 38 (*)    All other components within normal limits  LIPASE, BLOOD  SAMPLE TO BLOOD BANK  Imaging Review No results found.   EKG Interpretation None      MDM   Final diagnoses:  None    9:56 PM Filed Vitals:   05/16/14 1949 05/16/14 2000 05/16/14 2007 05/16/14 2100  BP: 145/100 139/93 139/93 142/81  Pulse: 91 76  92  Temp:      TempSrc:      Resp: 21 21 22    SpO2: 100% 100% 100% 97%   Patient with hematemesis.  NG tube placement unsuccessful. Nurses state that there is asn area of swellinghter apitent can fee within the esophagus that will not allow the tube to pass.  leukocytosis present no elevationin  lipase. Labs are otherwise unremarkable.   11:10 PM BP 138/89  Pulse 92  Temp(Src) 98.6 F (37 C) (Oral)  Resp 19  SpO2 99%  Patient tolerating fluids. Mild nausea now and vomiting clear fluids. Still complains of severe burning pain. I have placed a call to gastroenterology   11:17 PM BP 138/89  Pulse 92  Temp(Src) 98.6 F (37 C) (Oral)  Resp 19  SpO2 99% I spoke with Dr. Elnoria HowardHung who will perform endoscopy. Patient accepted for admission  hgb wnl. Patient with stable hgb.  The patient appears reasonably stabilized for admission considering the current resources, flow, and capabilities available in the ED at this time, and I doubt any other [facility] requiring further screening and/or treatment in the ED prior to admission. I personally reviewed the imaging tests through PACS system. I have reviewed and interpreted Lab values. I reviewed available ER/hospitalization records through the EMR    Arthor Captainbigail Harris, [place] JerseyPA-C 05/19/14 1522

## 2014-05-16 NOTE — ED Notes (Signed)
NG tube insertion unsuccessful x 2. Attempted with Lyanne CoEme S, RN. Tube coiling in back of patients throat. NG tube removed and Dr. Anitra LauthPlunkett and Cammy CopaAbigail PA made aware.

## 2014-05-16 NOTE — ED Notes (Signed)
Pt. reports nausea , vomitting , diarrhea onset yesterday and hematemesis today , denies fever or chills.

## 2014-05-17 ENCOUNTER — Encounter (HOSPITAL_COMMUNITY): Payer: BC Managed Care – PPO | Admitting: Anesthesiology

## 2014-05-17 ENCOUNTER — Encounter (HOSPITAL_COMMUNITY): Payer: Self-pay | Admitting: *Deleted

## 2014-05-17 ENCOUNTER — Inpatient Hospital Stay (HOSPITAL_COMMUNITY): Payer: BC Managed Care – PPO

## 2014-05-17 ENCOUNTER — Inpatient Hospital Stay (HOSPITAL_COMMUNITY): Payer: BC Managed Care – PPO | Admitting: Anesthesiology

## 2014-05-17 ENCOUNTER — Encounter (HOSPITAL_COMMUNITY)
Admission: EM | Disposition: A | Discharge status: Home / Self Care | Payer: Self-pay | Source: Home / Self Care | Attending: Internal Medicine

## 2014-05-17 DIAGNOSIS — K922 Gastrointestinal hemorrhage, unspecified: Secondary | ICD-10-CM | POA: Diagnosis present

## 2014-05-17 DIAGNOSIS — R197 Diarrhea, unspecified: Secondary | ICD-10-CM

## 2014-05-17 HISTORY — PX: ESOPHAGOGASTRODUODENOSCOPY: SHX5428

## 2014-05-17 LAB — ABO/RH: ABO/RH(D): O NEG

## 2014-05-17 LAB — CBC
HCT: 40.6 % (ref 39.0–52.0)
HCT: 39.1 % (ref 39.0–52.0)
HEMATOCRIT: 41.9 % (ref 39.0–52.0)
HEMATOCRIT: 40.1 % (ref 39.0–52.0)
HEMOGLOBIN: 13.6 g/dL (ref 13.0–17.0)
HEMOGLOBIN: 13.8 g/dL (ref 13.0–17.0)
HEMOGLOBIN: 13 g/dL (ref 13.0–17.0)
Hemoglobin: 13.1 g/dL (ref 13.0–17.0)
MCH: 33.7 pg (ref 26.0–34.0)
MCH: 33.7 pg (ref 26.0–34.0)
MCH: 33.6 pg (ref 26.0–34.0)
MCH: 33.8 pg (ref 26.0–34.0)
MCHC: 33.5 g/dL (ref 30.0–36.0)
MCHC: 32.9 g/dL (ref 30.0–36.0)
MCHC: 33.2 g/dL (ref 30.0–36.0)
MCHC: 32.7 g/dL (ref 30.0–36.0)
MCV: 100.7 fL — AB (ref 78.0–100.0)
MCV: 102.2 fL — ABNORMAL HIGH (ref 78.0–100.0)
MCV: 101 fL — AB (ref 78.0–100.0)
MCV: 103.4 fL — ABNORMAL HIGH (ref 78.0–100.0)
Platelets: 209 10*3/uL (ref 150–400)
Platelets: 140 10*3/uL — ABNORMAL LOW (ref 150–400)
Platelets: 196 10*3/uL (ref 150–400)
Platelets: 192 10*3/uL (ref 150–400)
RBC: 4.03 MIL/uL — AB (ref 4.22–5.81)
RBC: 4.1 MIL/uL — ABNORMAL LOW (ref 4.22–5.81)
RBC: 3.87 MIL/uL — ABNORMAL LOW (ref 4.22–5.81)
RBC: 3.88 MIL/uL — ABNORMAL LOW (ref 4.22–5.81)
RDW: 12.9 % (ref 11.5–15.5)
RDW: 12.9 % (ref 11.5–15.5)
RDW: 12.9 % (ref 11.5–15.5)
RDW: 12.8 % (ref 11.5–15.5)
WBC: 15.5 10*3/uL — AB (ref 4.0–10.5)
WBC: 13.3 10*3/uL — ABNORMAL HIGH (ref 4.0–10.5)
WBC: 9.2 10*3/uL (ref 4.0–10.5)
WBC: 9.4 10*3/uL (ref 4.0–10.5)

## 2014-05-17 LAB — TYPE AND SCREEN
ABO/RH(D): O NEG
Antibody Screen: NEGATIVE

## 2014-05-17 LAB — PROTIME-INR
INR: 1.05 (ref 0.00–1.49)
Prothrombin Time: 13.7 seconds (ref 11.6–15.2)

## 2014-05-17 LAB — CLOSTRIDIUM DIFFICILE BY PCR: Toxigenic C. Difficile by PCR: POSITIVE — AB

## 2014-05-17 SURGERY — EGD (ESOPHAGOGASTRODUODENOSCOPY)
Anesthesia: Monitor Anesthesia Care

## 2014-05-17 MED ORDER — PANTOPRAZOLE SODIUM 40 MG IV SOLR
40.0000 mg | Freq: Two times a day (BID) | INTRAVENOUS | Status: DC
Start: 2014-05-17 — End: 2014-05-17
  Administered 2014-05-17: 40 mg via INTRAVENOUS
  Filled 2014-05-17 (×3): qty 40

## 2014-05-17 MED ORDER — FOLIC ACID 1 MG PO TABS
1.0000 mg | ORAL_TABLET | Freq: Every day | ORAL | Status: DC
  Administered 2014-05-17 – 2014-05-18 (×2): 1 mg via ORAL
  Filled 2014-05-17 (×3): qty 1

## 2014-05-17 MED ORDER — ADULT MULTIVITAMIN W/MINERALS CH
1.0000 | ORAL_TABLET | Freq: Every day | ORAL | Status: DC
Start: 2014-05-17 — End: 2014-05-19
  Administered 2014-05-17 – 2014-05-18 (×2): 1 via ORAL
  Filled 2014-05-17 (×3): qty 1

## 2014-05-17 MED ORDER — VANCOMYCIN 50 MG/ML ORAL SOLUTION
125.0000 mg | Freq: Four times a day (QID) | ORAL | Status: DC
  Administered 2014-05-17 – 2014-05-18 (×5): 125 mg via ORAL
  Filled 2014-05-17 (×13): qty 2.5

## 2014-05-17 MED ORDER — ONDANSETRON HCL 4 MG/2ML IJ SOLN
INTRAMUSCULAR | Status: DC | PRN
  Administered 2014-05-17: 4 mg via INTRAVENOUS

## 2014-05-17 MED ORDER — ONDANSETRON HCL 4 MG PO TABS: 4.0000 mg | ORAL_TABLET | Freq: Four times a day (QID) | ORAL | Status: DC | PRN

## 2014-05-17 MED ORDER — ONDANSETRON HCL 4 MG/2ML IJ SOLN: 4.0000 mg | Freq: Four times a day (QID) | INTRAMUSCULAR | Status: DC | PRN

## 2014-05-17 MED ORDER — BUTAMBEN-TETRACAINE-BENZOCAINE 2-2-14 % EX AERO
INHALATION_SPRAY | CUTANEOUS | Status: DC | PRN
  Administered 2014-05-17: 2 via TOPICAL

## 2014-05-17 MED ORDER — LORAZEPAM 2 MG/ML IJ SOLN: 1.0000 mg | Freq: Four times a day (QID) | INTRAMUSCULAR | Status: DC | PRN

## 2014-05-17 MED ORDER — THIAMINE HCL 100 MG/ML IJ SOLN
100.0000 mg | Freq: Every day | INTRAMUSCULAR | Status: DC
  Filled 2014-05-17 (×3): qty 1

## 2014-05-17 MED ORDER — SODIUM CHLORIDE 0.9 % IV SOLN: INTRAVENOUS | Status: DC

## 2014-05-17 MED ORDER — SODIUM CHLORIDE 0.9 % IV SOLN: INTRAVENOUS | Status: DC | PRN

## 2014-05-17 MED ORDER — MIDAZOLAM HCL 5 MG/5ML IJ SOLN
INTRAMUSCULAR | Status: DC | PRN
  Administered 2014-05-17: 2 mg via INTRAVENOUS

## 2014-05-17 MED ORDER — LORAZEPAM 1 MG PO TABS: 1.0000 mg | ORAL_TABLET | Freq: Four times a day (QID) | ORAL | Status: DC | PRN

## 2014-05-17 MED ORDER — VITAMIN B-1 100 MG PO TABS
100.0000 mg | ORAL_TABLET | Freq: Every day | ORAL | Status: DC
  Administered 2014-05-17 – 2014-05-18 (×2): 100 mg via ORAL
  Filled 2014-05-17 (×3): qty 1

## 2014-05-17 MED ORDER — SODIUM CHLORIDE 0.9 % IV SOLN
INTRAVENOUS | Status: DC | PRN
  Administered 2014-05-17: 11:00:00 via INTRAVENOUS

## 2014-05-17 MED ORDER — KCL IN DEXTROSE-NACL 20-5-0.9 MEQ/L-%-% IV SOLN
INTRAVENOUS | Status: DC
  Administered 2014-05-17 (×3): via INTRAVENOUS
  Filled 2014-05-17 (×5): qty 1000

## 2014-05-17 MED ORDER — SODIUM CHLORIDE 0.9 % IJ SOLN
3.0000 mL | Freq: Two times a day (BID) | INTRAMUSCULAR | Status: DC
  Administered 2014-05-17: 3 mL via INTRAVENOUS

## 2014-05-17 MED ORDER — LIDOCAINE HCL (CARDIAC) 20 MG/ML IV SOLN
INTRAVENOUS | Status: DC | PRN
  Administered 2014-05-17: 100 mg via INTRAVENOUS

## 2014-05-17 MED ORDER — PROPOFOL INFUSION 10 MG/ML OPTIME
INTRAVENOUS | Status: DC | PRN
  Administered 2014-05-17: 140 ug/kg/min via INTRAVENOUS

## 2014-05-17 MED ORDER — FENTANYL CITRATE 0.05 MG/ML IJ SOLN
INTRAMUSCULAR | Status: DC | PRN
  Administered 2014-05-17 (×2): 50 ug via INTRAVENOUS

## 2014-05-17 NOTE — Anesthesia Procedure Notes (Signed)
Procedure Name: MAC Date/Time: 05/17/2014 11:15 AM Performed by: Lovie CholOCK, JENNIFER K Pre-anesthesia Checklist: Patient identified, Emergency Drugs available, Suction available, Patient being monitored and Timeout performed Oxygen Delivery Method: Nasal cannula

## 2014-05-17 NOTE — Anesthesia Postprocedure Evaluation (Signed)
  Anesthesia Post-op Note  Patient: Aaron [name]  Procedure(s) Performed: Procedure(s): ESOPHAGOGASTRODUODENOSCOPY (EGD) (N/A)  Patient Location: PACU  Anesthesia Type:MAC  Level of Consciousness: awake  Airway and Oxygen Therapy: Patient Spontanous Breathing  Post-op Pain: mild  Post-op Assessment: Post-op Vital signs reviewed  Post-op Vital Signs: Reviewed  Last Vitals:  Filed Vitals:   05/17/14 1139  BP:   Pulse: 65  Temp:   Resp: 16    Complications: No apparent anesthesia complications

## 2014-05-17 NOTE — Anesthesia Preprocedure Evaluation (Signed)
Anesthesia Evaluation  Patient identified by MRN, date of birth, ID band Patient awake    Reviewed: Allergy & Precautions, H&P , NPO status , Patient's Chart, lab work & pertinent test results  Airway Mallampati: II      Dental   Pulmonary former smoker,  breath sounds clear to auscultation        Cardiovascular Rhythm:Regular Rate:Normal     Neuro/Psych    GI/Hepatic GI history noted. CE   Endo/Other    Renal/GU      Musculoskeletal   Abdominal   Peds  Hematology   Anesthesia Other Findings   Reproductive/Obstetrics                           Anesthesia Physical Anesthesia Plan  ASA: III  Anesthesia Plan: MAC   Post-op Pain Management:    Induction: Intravenous  Airway Management Planned: Simple Face Mask  Additional Equipment:   Intra-op Plan:   Post-operative Plan:   Informed Consent: I have reviewed the patients History and Physical, chart, labs and discussed the procedure including the risks, benefits and alternatives for the proposed anesthesia with the patient or authorized representative who has indicated his/her understanding and acceptance.   Dental advisory given  Plan Discussed with: CRNA and Anesthesiologist  Anesthesia Plan Comments:         Anesthesia Quick Evaluation

## 2014-05-17 NOTE — Progress Notes (Signed)
Called ED for report room ready for admit. Will call back

## 2014-05-17 NOTE — Op Note (Signed)
[facility] [address] [place], [id number]   OPERATIVE PROCEDURE REPORT  PATIENT: Aaron [name], Aaron J.  MR#: [id number] BIRTHDATE: 1976-04-03  GENDER: Male ENDOSCOPIST: Jeani HawkingPatrick Hung, MD ASSISTANT:   Nada LibmanKatha Henderson, RN and Oletha Blendavida Shoffner, technician PROCEDURE DATE: 05/17/2014 PROCEDURE:   EGD w/ biopsy ASA CLASS:   Class II INDICATIONS:Hematemesis. MEDICATIONS: MAC sedation, administered by CRNA TOPICAL ANESTHETIC:   Cetacaine Spray  DESCRIPTION OF PROCEDURE:   After the risks benefits and alternatives of the procedure were thoroughly explained, informed consent was obtained.  The Pentax Gastroscope [id number]  endoscope was introduced through the mouth  and advanced to the second portion of the duodenum Without limitations.      The instrument was slowly withdrawn as the mucosa was fully examined.    FINDINGS: At the UES there was evidence of possible irritation, but this was a difficult area to inspect.  In the esophagus multiple concentric rings were identified and cold biopsies of the mid to upper esophagus were obtained to evaluate for EoE.  The Z-line was sharp.  No evidence of esophageal varices or esophagitis.  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  The gastric antrum and duodenum were normal.   Retroflexed views revealed no abnormalities.     The scope was then withdrawn from the patient and the procedure terminated.  COMPLICATIONS: There were no complications.  IMPRESSION: 1) ? EoE. 2) Portal HTN gastropathy.  RECOMMENDATIONS: 1) Await biopsy results. 2) Antiemetics. 3) Stop drinking ETOH.  _______________________________ Rosalie DoctoreSignedJeani Hawking:  Patrick Hung, MD 05/17/2014 11:31 AM

## 2014-05-17 NOTE — Progress Notes (Signed)
Reviewed HPI, HP and Assement a dn plan as per Dr. Dierdre SearlesLi  CDiff + started protocol Appreciate GI input D/c ppi-risk factors include chronic minocycline   Pleas KochJai Adron Geisel, MD [facility] [phone](P) [phone]

## 2014-05-17 NOTE — H&P (Signed)
[facility] History and Physical  Aaron [name] NWG:[id number] DOB: 01-Nov-1976    PCP:   Carollee Herter, MD   Chief Complaint: nausea, diarrhea, and hematemesis.  HPI: Aaron [name] is an 38 y.o. male with hx of intermittent, but heavy alcohol abuse, hx of prior MW tear( EGD 2010: MW tear, esosphagitis and ulcer, but no varices, and no other pathology), hx of cystic acne on Minocin for a few years, presents to the ER with diarrhea and vomiting for the past 3 days.  He was vomiting clear liquids, then with mixed blood.  There has been no epigastric pain, black or bloody stool.  He does smoke, but denied any other illlicit drug use.  No SOB, dizziness, or CP.  Evalaution in the ER included a Hb of 16 grams per dL, with normal LFTs and normal lipase. Hospitalist was asked to admit this patient for UGIB, diarrhea and vomiting.  Rewiew of Systems:  Constitutional: Negative for malaise, fever and chills. No significant weight loss or weight gain Eyes: Negative for eye pain, redness and discharge, diplopia, visual changes, or flashes of light. ENMT: Negative for ear pain, hoarseness, nasal congestion, sinus pressure and sore throat. No headaches; tinnitus, drooling, or problem swallowing. Cardiovascular: Negative for chest pain, palpitations, diaphoresis, dyspnea and peripheral edema. ; No orthopnea, PND Respiratory: Negative for cough, hemoptysis, wheezing and stridor. No pleuritic chestpain. Gastrointestinal: Negative for  constipation, abdominal pain, melena, blood in stool, jaundice and rectal bleeding.    Genitourinary: Negative for frequency, dysuria, incontinence,flank pain and hematuria; Musculoskeletal: Negative for back pain and neck pain. Negative for swelling and trauma.;  Skin: . Negative for pruritus, rash, abrasions, bruising and skin lesion.; ulcerations Neuro: Negative for headache, lightheadedness and neck stiffness. Negative for weakness, altered level of  consciousness , altered mental status, extremity weakness, burning feet, involuntary movement, seizure and syncope.  Psych: negative for anxiety, depression, insomnia, tearfulness, panic attacks, hallucinations, paranoia, suicidal or homicidal ideation    Past Medical History  Diagnosis Date  . Cystic acne     History reviewed. No pertinent past surgical history.  Medications:  HOME MEDS: Prior to Admission medications   Medication Sig Start Date End Date Taking? Authorizing Provider  minocycline (MINOCIN,DYNACIN) 50 MG capsule Take 50 mg by mouth 2 (two) times daily.   Yes Historical Provider, MD  omeprazole (PRILOSEC OTC) 20 MG tablet Take 20 mg by mouth daily.   Yes Historical Provider, MD  valACYclovir (VALTREX) 1000 MG tablet Take 2,000 mg by mouth 2 (two) times daily as needed (cold sores).   Yes Historical Provider, MD     Allergies:  No Known Allergies  Social History:   reports that he has quit smoking. He has quit using smokeless tobacco. He reports that he drinks about 3 ounces of alcohol per week. He reports that he does not use illicit drugs.  Family History: History reviewed. No pertinent family history.   Physical Exam: Filed Vitals:   05/16/14 2215 05/16/14 2216 05/16/14 2300 05/16/14 2330  BP: 138/89 138/89 112/64 138/76  Pulse: 88  91 89  Temp:      TempSrc:      Resp: 14 19 20 20   SpO2: 96% 99% 99% 98%   Blood pressure 138/76, pulse 89, temperature 98.6 F (37 C), temperature source Oral, resp. rate 20, SpO2 98.00%.  GEN:  Pleasant  patient lying in the stretcher in no acute distress; cooperative with exam. PSYCH:  alert and oriented x4; does not appear anxious  or depressed; affect is appropriate. HEENT: Mucous membranes pink and anicteric; PERRLA; EOM intact; no cervical lymphadenopathy nor thyromegaly or carotid bruit; no JVD; There were no stridor. Neck is very supple. Breasts:: Not examined CHEST WALL: No tenderness CHEST: Normal respiration,  clear to auscultation bilaterally.  HEART: Regular rate and rhythm.  There are no murmur, rub, or gallops.   BACK: No kyphosis or scoliosis; no CVA tenderness ABDOMEN: soft and non-tender; no masses, no organomegaly, normal abdominal bowel sounds; no pannus; no intertriginous candida. There is no rebound and no distention. Rectal Exam: Not done EXTREMITIES: No bone or joint deformity; age-appropriate arthropathy of the hands and knees; no edema; no ulcerations.  There is no calf tenderness. Genitalia: not examined PULSES: 2+ and symmetric SKIN: Normal hydration no rash or ulceration CNS: Cranial nerves 2-12 grossly intact no focal lateralizing neurologic deficit.  Speech is fluent; uvula elevated with phonation, facial symmetry and tongue midline. DTR are normal bilaterally, cerebella exam is intact, barbinski is negative and strengths are equaled bilaterally.  No sensory loss.   Labs on Admission:  Basic Metabolic Panel:  Recent Labs Lab 05/16/14 1925  NA 141  K 3.9  CL 96  CO2 22  GLUCOSE 115*  BUN 9  CREATININE 1.00  CALCIUM 10.0   Liver Function Tests:  Recent Labs Lab 05/16/14 1925  AST 38*  ALT 39  ALKPHOS 68  BILITOT 0.5  PROT 7.9  ALBUMIN 4.3    Recent Labs Lab 05/16/14 1925  LIPASE 36   No results found for this basename: AMMONIA,  in the last 168 hours CBC:  Recent Labs Lab 05/16/14 1925  WBC 11.4*  NEUTROABS 7.7  HGB 16.3  HCT 47.0  MCV 97.3  PLT 265   Cardiac Enzymes: No results found for this basename: CKTOTAL, CKMB, CKMBINDEX, TROPONINI,  in the last 168 hours  CBG: No results found for this basename: GLUCAP,  in the last 168 hours   Radiological Exams on Admission: Dg Abd Acute W/chest  05/16/2014   CLINICAL DATA:  Abdominal pain for few days. Vomiting with blood today.  EXAM: ACUTE ABDOMEN SERIES (ABDOMEN 2 VIEW & CHEST 1 VIEW)  COMPARISON:  Chest 08/25/2013  FINDINGS: There is no evidence of dilated bowel loops or free intraperitoneal  air. No radiopaque calculi or other significant radiographic abnormality is seen. Heart size and mediastinal contours are within normal limits. Both lungs are clear.  IMPRESSION: Negative abdominal radiographs.  No acute cardiopulmonary disease.   Electronically Signed   By: Burman NievesWilliam  Stevens M.D.   On: 05/16/2014 22:31    Assessment/Plan Present on Admission:  . Diarrhea . Upper GI bleed  PLAN:  This patient likely has a Mallory Weiss tear as he had clear vomiting, retching, followed by hematemesis.  He had similar presentation in 2010.  He has no peripheral stigmata for chronic liver disease. He will be unfortunately at risk for alcohol withdraw.  Will admit him to telemetry, give PPI q 12 hours, and made NPO.  Dr Elnoria HowardHung has been consulted by EDP, and said he will perform EGD in the am.  I will check H/H q 6 and Tx as needed.  He has no evidence of pancreatitis.  His diarrhea can just be from drinking too much beer (case a day), but stool was sent for culture and C diff.  He did take antibiotic for 2 years for his acne.  He is stable, full code, and [facility].  I did  advise him to stop alcohol and cigarettes.  Thank you for allowing me to participate in his care.  Other plans as per orders.  Code Status: FULL CODE.   Shermaine Rivet,PUnk LightningETER, MD. [facility] Pager [phone] 7pm to 7am.  05/17/2014, 12:11 AM

## 2014-05-17 NOTE — Progress Notes (Signed)
Utilization Review Completed.Aaron [name], Aaron [name]  

## 2014-05-17 NOTE — Transfer of Care (Signed)
Immediate Anesthesia Transfer of Care Note  Patient: Aaron [name]  Procedure(s) Performed: Procedure(s): ESOPHAGOGASTRODUODENOSCOPY (EGD) (N/A)  Patient Location: PACU and Endoscopy Unit  Anesthesia Type:MAC  Level of Consciousness: awake, alert , oriented and patient cooperative  Airway & Oxygen Therapy: Patient Spontanous Breathing and Patient connected to nasal cannula oxygen  Post-op Assessment: Report given to PACU RN and Post -op Vital signs reviewed and stable  Post vital signs: Reviewed  Complications: No apparent anesthesia complications

## 2014-05-17 NOTE — Consult Note (Signed)
Unassigned Consult  Reason for Consult: Hematemesis, Nausea, Vomiting, and Diarrhea Referring Physician: [facility]  Ledell J Gillaspie HPI: This is a 38 year old [name] with a PMH of a Mallory-Weiss tear in 2010 and history of PUD who is admitted for complaints of nausea, vomiting, hematemesis, and diarrhea.  He states that his symptoms started a couple of days ago with intermittent loose stools, which was accompanied by nausea and vomiting.  The patient reports that he vomits "violently" and yesterday during one of the episodes he saw some dark material.  With successive vomiting issues he started to have frank blood.  Upon admission his HGB was reportedly in the 16 range and he was hemodynamically stable.  No reports of any fever, melena, or hematochezia.  He denies any sick contacts.    Past Medical History  Diagnosis Date  . Cystic acne     History reviewed. No pertinent past surgical history.  History reviewed. No pertinent family history.  Social History:  reports that he has quit smoking. He has quit using smokeless tobacco. He reports that he drinks about 3 ounces of alcohol per week. He reports that he does not use illicit drugs.  Allergies: No Known Allergies  Medications:  Scheduled: . folic acid  1 mg Oral Daily  . multivitamin with minerals  1 tablet Oral Daily  . pantoprazole (PROTONIX) IV  40 mg Intravenous Q12H  . sodium chloride  3 mL Intravenous Q12H  . thiamine  100 mg Oral Daily   Or  . thiamine  100 mg Intravenous Daily   Continuous: . dextrose 5 % and 0.9 % NaCl with KCl 20 mEq/L 100 mL/hr at 05/17/14 0140    Results for orders placed during the hospital encounter of 05/16/14 (from the past 24 hour(s))  SAMPLE TO BLOOD BANK     Status: None   Collection Time    05/16/14  7:25 PM      Result Value Ref Range   Blood Bank Specimen SAMPLE AVAILABLE FOR TESTING     Sample Expiration 05/17/2014    CBC WITH DIFFERENTIAL     Status: Abnormal   Collection  Time    05/16/14  7:25 PM      Result Value Ref Range   WBC 11.4 (*) 4.0 - 10.5 K/uL   RBC 4.83  4.22 - 5.81 MIL/uL   Hemoglobin 16.3  13.0 - 17.0 g/dL   HCT 40.9  81.1 - 91.4 %   MCV 97.3  78.0 - 100.0 fL   MCH 33.7  26.0 - 34.0 pg   MCHC 34.7  30.0 - 36.0 g/dL   RDW 78.2  95.6 - 21.3 %   Platelets 265  150 - 400 K/uL   Neutrophils Relative % 68  43 - 77 %   Neutro Abs 7.7  1.7 - 7.7 K/uL   Lymphocytes Relative 25  12 - 46 %   Lymphs Abs 2.8  0.7 - 4.0 K/uL   Monocytes Relative 7  3 - 12 %   Monocytes Absolute 0.8  0.1 - 1.0 K/uL   Eosinophils Relative 0  0 - 5 %   Eosinophils Absolute 0.1  0.0 - 0.7 K/uL   Basophils Relative 0  0 - 1 %   Basophils Absolute 0.0  0.0 - 0.1 K/uL  COMPREHENSIVE METABOLIC PANEL     Status: Abnormal   Collection Time    05/16/14  7:25 PM      Result Value Ref Range  Sodium 141  137 - 147 mEq/L   Potassium 3.9  3.7 - 5.3 mEq/L   Chloride 96  96 - 112 mEq/L   CO2 22  19 - 32 mEq/L   Glucose, Bld 115 (*) 70 - 99 mg/dL   BUN 9  6 - 23 mg/dL   Creatinine, Ser 4.091.00  0.50 - 1.35 mg/dL   Calcium 81.110.0  8.4 - 91.410.5 mg/dL   Total Protein 7.9  6.0 - 8.3 g/dL   Albumin 4.3  3.5 - 5.2 g/dL   AST 38 (*) 0 - 37 U/L   ALT 39  0 - 53 U/L   Alkaline Phosphatase 68  39 - 117 U/L   Total Bilirubin 0.5  0.3 - 1.2 mg/dL   GFR calc non Af Amer >90  >90 mL/min   GFR calc Af Amer >90  >90 mL/min  LIPASE, BLOOD     Status: None   Collection Time    05/16/14  7:25 PM      Result Value Ref Range   Lipase 36  11 - Aaron U/L  TYPE AND SCREEN     Status: None   Collection Time    05/16/14  7:25 PM      Result Value Ref Range   ABO/RH(D) O NEG     Antibody Screen NEG     Sample Expiration 05/19/2014    ABO/RH     Status: None   Collection Time    05/16/14  7:25 PM      Result Value Ref Range   ABO/RH(D) O NEG    CBC     Status: Abnormal   Collection Time    05/17/14  2:40 AM      Result Value Ref Range   WBC 15.5 (*) 4.0 - 10.5 K/uL   RBC 4.03 (*) 4.22 - 5.81  MIL/uL   Hemoglobin 13.6  13.0 - 17.0 g/dL   HCT 78.240.6  95.639.0 - 21.352.0 %   MCV 100.7 (*) 78.0 - 100.0 fL   MCH 33.7  26.0 - 34.0 pg   MCHC 33.5  30.0 - 36.0 g/dL   RDW 08.612.9  57.811.5 - 46.915.5 %   Platelets 209  150 - 400 K/uL  PROTIME-INR     Status: None   Collection Time    05/17/14  2:40 AM      Result Value Ref Range   Prothrombin Time 13.7  11.6 - 15.2 seconds   INR 1.05  0.00 - 1.49  CBC     Status: Abnormal   Collection Time    05/17/14  6:45 AM      Result Value Ref Range   WBC 13.3 (*) 4.0 - 10.5 K/uL   RBC 4.10 (*) 4.22 - 5.81 MIL/uL   Hemoglobin 13.8  13.0 - 17.0 g/dL   HCT 62.941.9  52.839.0 - 41.352.0 %   MCV 102.2 (*) 78.0 - 100.0 fL   MCH 33.7  26.0 - 34.0 pg   MCHC 32.9  30.0 - 36.0 g/dL   RDW 24.412.9  01.011.5 - 27.215.5 %   Platelets 140 (*) 150 - 400 K/uL     Dg Abd Acute W/chest  05/16/2014   CLINICAL DATA:  Abdominal pain for few days. Vomiting with blood today.  EXAM: ACUTE ABDOMEN SERIES (ABDOMEN 2 VIEW & CHEST 1 VIEW)  COMPARISON:  Chest 08/25/2013  FINDINGS: There is no evidence of dilated bowel loops or free intraperitoneal air. No radiopaque calculi or other significant  radiographic abnormality is seen. Heart size and mediastinal contours are within normal limits. Both lungs are clear.  IMPRESSION: Negative abdominal radiographs.  No acute cardiopulmonary disease.   Electronically Signed   By: Burman NievesWilliam  Stevens M.D.   On: 05/16/2014 22:31    ROS:  As stated above in the HPI otherwise negative.  Blood pressure 134/80, pulse 62, temperature 98.7 F (37.1 C), temperature source Oral, resp. rate 18, height 6\' 3"  (1.905 m), weight 189 lb 9.5 oz (86 kg), SpO2 98.00%.    PE: Gen: NAD, Alert and Oriented HEENT:  [place]/AT, EOMI Neck: Supple, no LAD Lungs: CTA Bilaterally CV: RRR without M/G/R ABM: Soft, NTND, +BS Ext: No C/C/E  Assessment/Plan: 1) Hematemesis. 2) History of Mallory-Weiss tear. 3) Diarrhea. 4) Nausea/Vomiting. 5) GERD.   He remains hemodynamically stable and his HGB  is at 13.6 g/dL.  With his clinical presentation it is consistent with another Mallory-Weiss tear, but I will perform an EGD for further evaluation.  He reports a history of GERD.  His current symptoms of diarrhea appear to be viral and hopefully it should resolve soon.  Plan: 1) EGD today. 2) Continue with PPI.  HUNG,PATRICK D 05/17/2014, 8:01 AM

## 2014-05-17 NOTE — Progress Notes (Signed)
Pt gone down for ABD US.

## 2014-05-18 DIAGNOSIS — F1020 Alcohol dependence, uncomplicated: Secondary | ICD-10-CM

## 2014-05-18 DIAGNOSIS — K922 Gastrointestinal hemorrhage, unspecified: Principal | ICD-10-CM

## 2014-05-18 DIAGNOSIS — A0472 Enterocolitis due to Clostridium difficile, not specified as recurrent: Secondary | ICD-10-CM

## 2014-05-18 LAB — HEPATITIS C ANTIBODY: HCV Ab: NEGATIVE

## 2014-05-18 LAB — HEPATITIS B SURFACE ANTIGEN: HEP B S AG: NEGATIVE

## 2014-05-18 LAB — HEPATITIS B SURFACE ANTIBODY,QUALITATIVE: Hep B S Ab: NEGATIVE

## 2014-05-18 NOTE — Progress Notes (Signed)
    Progress Note  Covering for Dr. Elnoria HowardHung   Subjective  feels okay, wants to go home   Objective   Vital signs in last 24 hours: Temp:  [98.2 F (36.8 C)-99.3 F (37.4 C)] 98.6 F (37 C) (06/27 0910) Pulse Rate:  [55-88] 56 (06/27 0910) Resp:  [11-19] 16 (06/27 0910) BP: (110-141)/(70-95) 126/84 mmHg (06/27 0910) SpO2:  [98 %-100 %] 98 % (06/27 0910) Last BM Date: 05/18/14 General:    white male in NAD Heart:  Regular rate and rhythm Abdomen:  Soft, nontender and nondistended. Normal bowel sounds. Extremities:  Without edema. Neurologic:  Alert and oriented,  grossly normal neurologically. Psych:  Cooperative. Normal mood and affect.  Lab Results:  Recent Labs  05/17/14 0645 05/17/14 1311 05/17/14 1905  WBC 13.3* 9.2 9.4  HGB 13.8 13.0 13.1  HCT 41.9 39.1 40.1  PLT 140* 196 192   BMET  Recent Labs  05/16/14 1925  NA 141  K 3.9  CL 96  CO2 22  GLUCOSE 115*  BUN 9  CREATININE 1.00  CALCIUM 10.0   LFT  Recent Labs  05/16/14 1925  PROT 7.9  ALBUMIN 4.3  AST 38*  ALT 39  ALKPHOS 68  BILITOT 0.5   PT/INR  Recent Labs  05/17/14 0240  LABPROT 13.7  INR 1.05    Studies/Results: [place] Abdomen Limited  05/17/2014   CLINICAL DATA:  Evaluate for any evidence of cirrhosis.  EXAM: US ABDOMEN LIMITED - RIGHT UPPER QUADRANT  COMPARISON:  None.  FINDINGS: Gallbladder:  Layering sludge in the gallbladder fundus. No stones. No gallbladder wall thickening or pericholecystic fluid. Sonography reports negative sonographic Murphy's sign.  Common bile duct:  Diameter: 3.3 mm, unremarkable  Liver:  No focal lesion identified. Within normal limits in parenchymal echogenicity.  IMPRESSION: 1. Negative right upper quadrant ultrasound. This limited study, by design, does not assess for splenomegaly nor for ascites, which can be secondary signs of cirrhosis and portal venous hypertension.   Electronically Signed   By: Oley Balmaniel  Hassell M.D.   On: 05/17/2014 18:08     Assessment / Plan:   31. 38 year old male with hematemesis. EGD revealed portal HTN, no varices. Hgb stable at 13.1. No further bleeding. Wants to go home. Reiterated need to avoid ETOH in interest of liver among other things. Limited RUQ ultrasound negative for cirrhosis. Albumin, platelets and INR normal.  2. C-diff. On minocycline "for years" for acne. This is only identifiable risk factor. Asked patient to talk with his Dermatologist about different acne regimen (one not containing antibiotics)Patient on Vanco suspension which is fine but Flagyl may be easier for him to obtain. Needs 14 days of treatment. Cautioned patient that c-diff is contagious and to take precautions.   3. Possible eosinophilic esophagitis, biopsies pending. No swallowing problems.   LOS: 2 days   Willette Clusteraula Guenther  05/18/2014, 10:57 AM

## 2014-05-18 NOTE — Progress Notes (Signed)
I agree with the above documentation, including the assessment and plan. Outpatient follow-up with Dr. Elnoria HowardHung recommended

## 2014-05-18 NOTE — Progress Notes (Signed)
Note: This document was prepared with digital dictation and possible smart phrase technology. Any transcriptional errors that result from this process are unintentional.   Aaron PughRick J [name] RUE:[id number] DOB: [birth year] DOA: 05/16/2014 PCP: Carollee HerterLALONDE,JOHN CHARLES, MD  Brief narrative: 38 year old male known history of cystic acne on minocycline suppressive therapy, reflux on Protonix prior heavy drinking admitted with nausea vomiting diarrhea found to be C. difficile positive  Past medical history-As per Problem list Chart reviewed as below- None  Consultants:  GI  Procedures:  Endoscopy 6/26  Ultrasound abdomen 6/26  Antibiotics:  Vancomycin 6/26   Subjective  Doing fair. Stool seemed to be more firm No nausea no vomiting no chest pain Eating and drinking    Objective    Interim History: None  Telemetry: None   Objective: Filed Vitals:   05/17/14 1139 05/17/14 1823 05/17/14 2100 05/18/14 0500  BP:  135/70 134/88 110/74  Pulse: 65 55 80 88  Temp:  98.9 F (37.2 C) 99.3 F (37.4 C) 98.2 F (36.8 C)  TempSrc:  Oral Oral Oral  Resp: 16 18 18 18   Height:      Weight:      SpO2: 100% 100% 100% 99%    Intake/Output Summary (Last 24 hours) at 05/18/14 0845 Last data filed at 05/18/14 0800  Gross per 24 hour  Intake   3880 ml  Output      0 ml  Net   3880 ml    Exam:  General: Alert pleasant oriented no apparent distress Cardiovascular: S1-S2 no murmur rub or gallop Respiratory: Clinically clear Abdomen: Soft nontender no rebound no guarding Skin no lower extremity edema Neuro intact  Data Reviewed: Basic Metabolic Panel:  Recent Labs Lab 05/16/14 1925  NA 141  K 3.9  CL 96  CO2 22  GLUCOSE 115*  BUN 9  CREATININE 1.00  CALCIUM 10.0   Liver Function Tests:  Recent Labs Lab 05/16/14 1925  AST 38*  ALT 39  ALKPHOS 68  BILITOT 0.5  PROT 7.9  ALBUMIN 4.3    Recent Labs Lab 05/16/14 1925  LIPASE 36   No results found for  this basename: AMMONIA,  in the last 168 hours CBC:  Recent Labs Lab 05/16/14 1925 05/17/14 0240 05/17/14 0645 05/17/14 1311 05/17/14 1905  WBC 11.4* 15.5* 13.3* 9.2 9.4  NEUTROABS 7.7  --   --   --   --   HGB 16.3 13.6 13.8 13.0 13.1  HCT 47.0 40.6 41.9 39.1 40.1  MCV 97.3 100.7* 102.2* 101.0* 103.4*  PLT 265 209 140* 196 192   Cardiac Enzymes: No results found for this basename: CKTOTAL, CKMB, CKMBINDEX, TROPONINI,  in the last 168 hours BNP: No components found with this basename: POCBNP,  CBG: No results found for this basename: GLUCAP,  in the last 168 hours  Recent Results (from the past 240 hour(s))  CLOSTRIDIUM DIFFICILE BY PCR     Status: Abnormal   Collection Time    05/17/14  9:25 AM      Result Value Ref Range Status   C difficile by pcr POSITIVE (*) NEGATIVE Final   Comment: CRITICAL RESULT CALLED TO, READ BACK BY AND VERIFIED WITH:     J. AKINS RN 11:55 05/17/14 ([email or web address])     Studies:              All Imaging reviewed and is as per above notation   Scheduled Meds: . folic acid  1 mg Oral Daily  . multivitamin  with minerals  1 tablet Oral Daily  . sodium chloride  3 mL Intravenous Q12H  . thiamine  100 mg Oral Daily   Or  . thiamine  100 mg Intravenous Daily  . vancomycin  125 mg Oral QID   Continuous Infusions: . dextrose 5 % and 0.9 % NaCl with KCl 20 mEq/L 100 mL/hr at 05/17/14 2136     Assessment/Plan: 1. C. difficile colitis-started vancomycin 6/26. Continue for 10-14 days. Patient has been instructed to hold off on minocycline on discharge until full course is complete. He would need to discontinue his TPN use alternate therapy for correction probably at least short-term. 2. Nausea vomiting-EGD performed 6/26 = portal hypertensive gastropathy. Await biopsy results. 3. History Mallory-Weiss tear 2010 4. Cystic acne-see above discussion  Code Status: Full Family Communication: None at bedside Disposition Plan: Inpatient none  telemetry   Pleas KochJai Samtani, MD  [facility] Pager [phone] 05/18/2014, 8:45 AM    LOS: 2 days

## 2014-05-18 NOTE — Plan of Care (Signed)
Problem: Phase II Progression Outcomes Goal: Progress activity as tolerated unless otherwise ordered Outcome: Completed/Met Date Met:  05/18/14 Up ad lib in room. Showered once telemetry was removed. Goal: IV changed to normal saline lock Outcome: Completed/Met Date Met:  05/18/14 IV D/C'd

## 2014-05-19 MED ORDER — METRONIDAZOLE 500 MG PO TABS: 500.0000 mg | ORAL_TABLET | Freq: Three times a day (TID) | ORAL | Status: AC

## 2014-05-19 MED ORDER — METRONIDAZOLE 500 MG PO TABS
500.0000 mg | ORAL_TABLET | Freq: Three times a day (TID) | ORAL | Status: DC
  Filled 2014-05-19 (×4): qty 1

## 2014-05-19 MED ORDER — MINOCYCLINE HCL 50 MG PO CAPS: 50.0000 mg | ORAL_CAPSULE | Freq: Two times a day (BID) | ORAL | Status: AC

## 2014-05-19 NOTE — Progress Notes (Signed)
Pt discharged to home after visit summary reviewed and pt capable of re verbalizing medications and follow up appointments. Pt remains stable. No signs and symptoms of distress. Educated to return to ER in the event of SOB, dizziness, chest pain, or fainting. TATJANA HICKS, RN   

## 2014-05-19 NOTE — Discharge Summary (Signed)
Physician Discharge Summary  West PughRick J Shiflett [id number] DOB: 12/06/1975 DOA: 05/16/2014  PCP: Carollee HerterLALONDE,JOHN CHARLES, MD  Admit date: 05/16/2014 Discharge date: 05/19/2014  Time spent: 25 minutes  Recommendations for Outpatient Follow-up:  1. Complete flagyl 05/31/14 2. Precautions advised regarding PPI use antibiotic use and recommended to discontinue suppressive minocycline  3. Outpatient followup with gastroenterology advised  Discharge Diagnoses:  Principal Problem:   Acute upper GI bleeding Active Problems:   Alcoholism   Diarrhea   Upper GI bleed   Discharge Condition: Good  Diet recommendation: Regular  Filed Weights   05/17/14 0115 05/18/14 2059  Weight: 86 kg (189 lb 9.5 oz) 85.999 kg (189 lb 9.5 oz)    History of present illness:  38 year old male known history of cystic acne on minocycline suppressive therapy, reflux on Protonix prior heavy drinking admitted with nausea vomiting diarrhea found to be C. difficile positive   Hospital Course:   1. C. difficile colitis-started vancomycin 6/26. Continue for 14 days ending 05/31/14. Patient has been instructed to hold off on minocycline on discharge until full course is complete. He would need to discontinue his PPI and use alternate therapy that comes for correction probably at least short-term. 2. Nausea vomiting-EGD performed 6/26 = portal hypertensive gastropathy. Await biopsy results-this will be followed up as an outpatient. In addition ultrasound the same day was negative for any acute intra-abdominal findings   3. History Mallory-Weiss tear 2010 4. Cystic acne-see above discussion   Procedures:  Endoscopy 6/26  Ultrasound abdomen 6/26  Consultations:  Gastroenterology  Discharge Exam: Filed Vitals:   05/19/14 0441  BP: 120/73  Pulse: 57  Temp: 98.2 F (36.8 C)  Resp: 18    General:  pleasant oriented no apparent distress  Cardiovascular: S1-S2 no murmur rub or  Respiratory: Clinically    Discharge Instructions You were cared for by a hospitalist during your hospital stay. If you have any questions about your discharge medications or the care you received while you were in the hospital after you are discharged, you can call the unit and asked to speak with the hospitalist on call if the hospitalist that took care of you is not available. Once you are discharged, your primary care physician will handle any further medical issues. Please note that NO REFILLS for any discharge medications will be authorized once you are discharged, as it is imperative that you return to your primary care physician (or establish a relationship with a primary care physician if you do not have one) for your aftercare needs so that they can reassess your need for medications and monitor your lab values.  Discharge Instructions   Diet - low sodium heart healthy    Complete by:  As directed      Discharge instructions    Complete by:  As directed   Avoid taking minocycline until your Clostridium difficile clears up Avoid proton pump inhibitor like Prilosec-if you have severe reflux like symptoms I would recommend cautious use of TUMS Followup with your primary care physician in about a week Complete Flagyl on 05/31/14 Please followup with gastroenterology Dr. Elnoria HowardHung and schedule an appointment to see him to followup biopsy results of your upper endoscopy     Increase activity slowly    Complete by:  As directed             Medication List    STOP taking these medications       omeprazole 20 MG tablet  Commonly known as:  PRILOSEC OTC  TAKE these medications       metroNIDAZOLE 500 MG tablet  Commonly known as:  FLAGYL  Take 1 tablet (500 mg total) by mouth 3 (three) times daily.     minocycline 50 MG capsule  Commonly known as:  MINOCIN,DYNACIN  Take 1 capsule (50 mg total) by mouth 2 (two) times daily.     valACYclovir 1000 MG tablet  Commonly known as:  VALTREX  Take 2,000 mg by  mouth 2 (two) times daily as needed (cold sores).       No Known Allergies    The results of significant diagnostics from this hospitalization (including imaging, microbiology, ancillary and laboratory) are listed below for reference.    Significant Diagnostic Studies: [place] Abdomen Limited  05/17/2014   CLINICAL DATA:  Evaluate for any evidence of cirrhosis.  EXAM: US ABDOMEN LIMITED - RIGHT UPPER QUADRANT  COMPARISON:  None.  FINDINGS: Gallbladder:  Layering sludge in the gallbladder fundus. No stones. No gallbladder wall thickening or pericholecystic fluid. Sonography reports negative sonographic Murphy's sign.  Common bile duct:  Diameter: 3.3 mm, unremarkable  Liver:  No focal lesion identified. Within normal limits in parenchymal echogenicity.  IMPRESSION: 1. Negative right upper quadrant ultrasound. This limited study, by design, does not assess for splenomegaly nor for ascites, which can be secondary signs of cirrhosis and portal venous hypertension.   Electronically Signed   By: Oley Balmaniel  Hassell M.D.   On: 05/17/2014 18:08   Dg Abd Acute W/chest  05/16/2014   CLINICAL DATA:  Abdominal pain for few days. Vomiting with blood today.  EXAM: ACUTE ABDOMEN SERIES (ABDOMEN 2 VIEW & CHEST 1 VIEW)  COMPARISON:  Chest 08/25/2013  FINDINGS: There is no evidence of dilated bowel loops or free intraperitoneal air. No radiopaque calculi or other significant radiographic abnormality is seen. Heart size and mediastinal contours are within normal limits. Both lungs are clear.  IMPRESSION: Negative abdominal radiographs.  No acute cardiopulmonary disease.   Electronically Signed   By: Burman NievesWilliam  Stevens M.D.   On: 05/16/2014 22:31    Microbiology: Recent Results (from the past 240 hour(s))  STOOL CULTURE     Status: None   Collection Time    05/17/14  9:25 AM      Result Value Ref Range Status   Specimen Description STOOL   Final   Special Requests NONE   Final   Culture     Final   Value: Culture  reincubated for better growth     [facility]   Report Status PENDING   Incomplete  CLOSTRIDIUM DIFFICILE BY PCR     Status: Abnormal   Collection Time    05/17/14  9:25 AM      Result Value Ref Range Status   C difficile by pcr POSITIVE (*) NEGATIVE Final   Comment: CRITICAL RESULT CALLED TO, READ BACK BY AND VERIFIED WITH:     J. AKINS RN 11:55 05/17/14 ([email or web address])     Labs: Basic Metabolic Panel:  Recent Labs Lab 05/16/14 1925  NA 141  K 3.9  CL 96  CO2 22  GLUCOSE 115*  BUN 9  CREATININE 1.00  CALCIUM 10.0   Liver Function Tests:  Recent Labs Lab 05/16/14 1925  AST 38*  ALT 39  ALKPHOS 68  BILITOT 0.5  PROT 7.9  ALBUMIN 4.3    Recent Labs Lab 05/16/14 1925  LIPASE 36   No results found for this basename: AMMONIA,  in the last 168 hours  CBC:  Recent Labs Lab 05/16/14 1925 05/17/14 0240 05/17/14 0645 05/17/14 1311 05/17/14 1905  WBC 11.4* 15.5* 13.3* 9.2 9.4  NEUTROABS 7.7  --   --   --   --   HGB 16.3 13.6 13.8 13.0 13.1  HCT 47.0 40.6 41.9 39.1 40.1  MCV 97.3 100.7* 102.2* 101.0* 103.4*  PLT 265 209 140* 196 192   Cardiac Enzymes: No results found for this basename: CKTOTAL, CKMB, CKMBINDEX, TROPONINI,  in the last 168 hours BNP: BNP (last 3 results) No results found for this basename: PROBNP,  in the last 8760 hours CBG: No results found for this basename: GLUCAP,  in the last 168 hours     Signed:  Rhetta Mura  [facility] 05/19/2014, 8:23 AM

## 2014-05-20 ENCOUNTER — Encounter (HOSPITAL_COMMUNITY): Payer: Self-pay | Admitting: Gastroenterology

## 2014-05-21 LAB — STOOL CULTURE

## 2014-05-21 NOTE — ED Provider Notes (Signed)
Medical screening examination/treatment/procedure(s) were performed by non-physician practitioner and as supervising physician I was immediately available for consultation/collaboration.   EKG Interpretation None        Gwyneth SproutWhitney Plunkett, MD 05/21/14 [phone]

## 2014-06-04 ENCOUNTER — Other Ambulatory Visit: Payer: Self-pay | Admitting: Dermatology

## 2014-12-21 ENCOUNTER — Other Ambulatory Visit: Payer: Self-pay | Admitting: Family Medicine

## 2014-12-23 NOTE — Telephone Encounter (Signed)
Is this okay to refill? 

## 2015-06-25 ENCOUNTER — Emergency Department (HOSPITAL_COMMUNITY)
Admission: EM | Admit: 2015-06-25 | Discharge: 2015-06-25 | Disposition: A | Discharge status: Home / Self Care | Payer: BC Managed Care – PPO | Attending: Emergency Medicine | Admitting: Emergency Medicine

## 2015-06-25 ENCOUNTER — Emergency Department (HOSPITAL_COMMUNITY): Payer: BC Managed Care – PPO

## 2015-06-25 ENCOUNTER — Encounter (HOSPITAL_COMMUNITY): Payer: Self-pay | Admitting: *Deleted

## 2015-06-25 DIAGNOSIS — Y9289 Other specified places as the place of occurrence of the external cause: Secondary | ICD-10-CM | POA: Insufficient documentation

## 2015-06-25 DIAGNOSIS — Y990 Civilian activity done for income or pay: Secondary | ICD-10-CM | POA: Insufficient documentation

## 2015-06-25 DIAGNOSIS — X58XXXA Exposure to other specified factors, initial encounter: Secondary | ICD-10-CM | POA: Insufficient documentation

## 2015-06-25 DIAGNOSIS — Z792 Long term (current) use of antibiotics: Secondary | ICD-10-CM | POA: Insufficient documentation

## 2015-06-25 DIAGNOSIS — Y9389 Activity, other specified: Secondary | ICD-10-CM | POA: Insufficient documentation

## 2015-06-25 DIAGNOSIS — S8392XA Sprain of unspecified site of left knee, initial encounter: Secondary | ICD-10-CM

## 2015-06-25 DIAGNOSIS — Z79899 Other long term (current) drug therapy: Secondary | ICD-10-CM | POA: Insufficient documentation

## 2015-06-25 DIAGNOSIS — Z87891 Personal history of nicotine dependence: Secondary | ICD-10-CM | POA: Insufficient documentation

## 2015-06-25 MED ORDER — KETOROLAC TROMETHAMINE 30 MG/ML IJ SOLN
30.0000 mg | Freq: Once | INTRAMUSCULAR | Status: DC
  Filled 2015-06-25: qty 1

## 2015-06-25 MED ORDER — MELOXICAM 7.5 MG PO TABS: 7.5000 mg | ORAL_TABLET | Freq: Every day | ORAL | Status: AC

## 2015-06-25 MED ORDER — KETOROLAC TROMETHAMINE 30 MG/ML IJ SOLN
30.0000 mg | Freq: Once | INTRAMUSCULAR | Status: AC
  Administered 2015-06-25: 30 mg via INTRAMUSCULAR

## 2015-06-25 NOTE — ED Notes (Signed)
Pt c/o a twisted lt knee earlier today.  Painful with walking

## 2015-06-25 NOTE — ED Notes (Signed)
Pt in xray

## 2015-06-25 NOTE — ED Notes (Signed)
Pt returned from xray, pt words slurring and pt slow to respond. Pt denies taking any medications for knee pain.   Pt reports that he fell at work today and tried to make it through the day but reports pain got worse, limited ROM due to pain and tenderness noted to medial aspect of knee. Bruising also noted to knee, nad noted.

## 2015-06-25 NOTE — Discharge Instructions (Signed)
Take acetaminophen (Tylenol) up to 975 mg (this is normally 3 over-the-counter pills) up to 3 times a day. Do not drink alcohol. Make sure your other medications do not contain acetaminophen (Read the labels!)  Do not hesitate to return to the emergency room for any new, worsening or concerning symptoms.  Please obtain primary care using resource guide below. Let them know that you were seen in the emergency room and that they will need to obtain records for further outpatient management.    Emergency Department Resource Guide 1) Find a Doctor and Pay Out of Pocket Although you won't have to find out who is covered by your insurance plan, it is a good idea to ask around and get recommendations. You will then need to call the office and see if the doctor you have chosen will accept you as a new patient and what types of options they offer for patients who are self-pay. Some doctors offer discounts or will set up payment plans for their patients who do not have insurance, but you will need to ask so you aren't surprised when you get to your appointment.  2) Contact Your Local Health Department Not all health departments have doctors that can see patients for sick visits, but many do, so it is worth a call to see if yours does. If you don't know where your local health department is, you can check in your phone book. The CDC also has a tool to help you locate your state's health department, and many state websites also have listings of all of their local health departments.  3) Find a Walk-in Clinic If your illness is not likely to be very severe or complicated, you may want to try a walk in clinic. These are popping up all over the country in pharmacies, drugstores, and shopping centers. They're usually staffed by nurse practitioners or physician assistants that have been trained to treat common illnesses and complaints. They're usually fairly quick and inexpensive. However, if you have serious medical  issues or chronic medical problems, these are probably not your best option.  No Primary Care Doctor: - Call Health Connect at  [phone] - they can help you locate a primary care doctor that  accepts your insurance, provides certain services, etc. - Physician Referral Service- [phone]  Chronic Pain Problems: Organization         Address  Phone   Notes  [facility]  [phone] Patients need to be referred by their primary care doctor.   Medication Assistance: Organization         Address  Phone   Notes  [facility] Med[facility] [address] [place] [place], [place] [address] [phone] --Must be a resident of [facility] -- Must have NO insurance coverage whatsoever (no Medicaid/ Medicare, etc.) -- The pt. MUST have a primary care doctor that directs their care regularly and follows them in the community   [facility]  [phone]   [employer]  [phone]    Agencies that provide inexpensive medical care: Organization         Address  Phone   Notes  [facility]  [phone]   [facility]    [phone]   [facility] [address] [place], [place] [address] [phone]   [facility] [address], [place] [phone]   [facility]anned Parenthood    [phone]  [facility]    [phone]   [facility]  [address], [place] Phone:  [phone], Fax:  [phone] Hours of Operation:  9 am - 6 pm, M-F.  Also accepts Medicaid/Medicare and self-pay.  [facility] for [place] [address], [address], [place] Phone: [phone], Fax: [phone]. Hours of Operation:  8:30 am - 5:30 pm, M-F.  Also accepts Medicaid and self-pay.  [facility] [place] [address], [place] Phone: [phone]   [place], [place], [place]  [phone], Ext. [phone] Mondays & Thursdays: 7-9 AM.  First 15 patients are seen on a first come, first serve basis.    [place] Providers:  Organization         Address  Phone   Notes  [facility] [address], [place] [phone] Also accepts self-pay patients.  [facility] [address], [place]  [phone]   [place], [address], [place] [phone]   [facility] [address] [place] [phone]   Lucianne Lei [address], [place]   [phone] Only accepts [place] Access [place] patients after they have their name applied to their card.   Self-Pay (no insurance) in [facility]:  Organization         Address  Phone   Notes  Sickle Cell Patients, [facility] [place] [phone]   [facility] [place] [phone]   [facility] [place]  [place], [place], [place] [phone]   [facility]/Dr. Osei-Bonsu  [address] [place] or [address], [place] [phone] Phone number for both [place] and [place] locations is the same.  [facility] [address] [place] [phone]   [facility] [address] [place] or [address] [phone]   [facility] [address], [place] [phone], phone; [phone], fax Sees patients 1st and 3rd Saturday of every month.  Must not qualify for public or private insurance (i.e. Medicaid, Medicare, [place] Health Choice, Veterans' Benefits)  Household income should be no more than 200% of the poverty level The clinic cannot treat you if you are pregnant or think you are pregnant  Sexually transmitted  diseases are not treated at the clinic.    Dental Care: Organization         Address  Phone  Notes  [facility] Department [facility] [place] [phone] Accepts children up to age 15 who are enrolled in [place] or [place]; pregnant women with a Medicaid card; and children who have applied for Medicaid or [place] Health Choice, but were declined, whose parents can pay a reduced fee at time of service.  [facility]  [address], [place] [phone] Accepts children up to age 40 who are enrolled in [place] or [place]; pregnant women with a Medicaid card; and children who have applied for Medicaid or [place] Health Choice, but were declined, whose parents can pay a reduced fee at time  of service.  °[facility] ° [address], [place] [phone] Patients are seen by appointment only. Walk-ins are not accepted. [facility] will see patients 18 years of age and older. °Monday - Tuesday (8am-5pm) °Most Wednesdays (8:30-5pm) °$30 per visit, cash only  °[facility] ° [address], [place] [phone] Patients are seen by appointment only. Walk-ins are not accepted. [facility] will see patients 18 years of age and older. °One Wednesday Evening (Monthly: Volunteer Based).  $30 per visit, cash only  °[facility]  [phone] for adults; Children under age 4, call Graduate Pediatric Dentistry at [phone]. Children aged 4-14, please call [phone] to request a pediatric application. ° Dental services are provided in all areas of dental care including fillings, crowns and bridges, complete and partial dentures, implants, gum treatment, root canals, and extractions. Preventive care is also provided. Treatment is provided to both adults and children. °Patients are selected via a  lottery and there is often a waiting list. °  °[facility] [address], °[place] ° ([phone] [email or web address] °  °[facility] [address], [place], [place] [phone], Ext. [phone] Second and Fourth Thursday of each month, opens at 6:30 AM; Clinic ends at 9 AM.  Patients are seen on a first-come first-served basis, and a limited number are seen during each clinic.  ° °[facility] ° [address], [place], [place] [phone]   Eligibility Requirements °You must have lived in [place], [place], or [place] counties for at least the last three months. °  You cannot be eligible for state or federal sponsored healthcare insurance, including Veterans Administration, Medicaid, or Medicare. °  You generally cannot be eligible for healthcare insurance through your employer.  °  How to apply: °Eligibility screenings are held every Tuesday and Wednesday afternoon from 1:00 pm until 4:00 pm. You do not need an appointment for the interview!  °[facility] [address], [place], [place] [phone]   °[facility]  [phone]   °[facility]  [phone]   °[facility]  336-570-6415   ° °Behavioral Health Resources in the Community: °Intensive Outpatient Programs °Organization         Address  Phone  Notes  °[facility] [address], [place], [place] [phone]   °[facility] [address], [place], [place] [phone]   °[facility] [address], [place], [place] ° [phone]   °[facility] [address],  °[place], [place] 1-800-853-5163 or [phone]   °Substance Abuse Resources °Organization         Address  Phone  Notes  °[facility]  336-882-2125   °[facility]  [phone]   °[facility]  [phone]   °[facility]  [phone]   °Residential &  Outpatient Substance Abuse Program  1-800-659-3381   °Psychological Services °Organization         Address  Phone  Notes  °[facility]  336- [phone]   °[facility]  336- [phone]   °[facility] [address], [place] 1-800-853-5163 or 336-641-4981   ° °Mobile Crisis Teams °Organization         Address  Phone  Notes  °[facility], Mobile Crisis Care Unit  1-877-626-1772   °Assertive °Psychotherapeutic   Services  [address] [place], [place]   [facility] [address], [place] [phone]    Self-Help/Support Groups Organization         Address  Phone             Notes  [place]. of [place] - variety of support groups  [place] Call for more information  Narcotics Anonymous (NA), [facility] [address], [employer] [place]  2 meetings at this location   Special educational needs teacher         Address  Phone  Notes  [facility] [place],    [place]  1-6361518779   [facility]  [address] [place] [address], [place], [place]   [place], [place] [phone] Admissions: 8am-3pm M-F  Incentives Substance [place] [address]    [place], [place] [phone]   [facility] [address] [place], [place], [place]   [facility] [address],  [place], [place]   [facility] - Intensive Outpatient [address], [place], [place]   [facility] ([place].,  [place], [place] [phone]-(302)[phone] or [phone]   [facility] ([facility]) [address] [place], [place] Accepts Medicaid  [facility] [place] [address],  [place] 1-7401063853 Substance Abuse/Addiction Treatment   [facility] Organization          Address  Phone  Notes  [facility]  6098293892   Domenic Schwab, PhD [address] [place], [place]   [phone] or [phone]   [place], [place] [phone]   [facility] [address], [place], [place] [phone] Insurance/Medicaid/sponsorship through [facility] [address], [address] [place]                                    [place], [place] [phone] [place] [address], [place] [phone]    Dr. Adele Schilder  [phone]   [facility] Dept. 1) [address] [place] 2) [place] 3)  [place] [address], [place] [phone]  [phone]   [place] [phone] or [phone] (After Hours)

## 2015-06-25 NOTE — ED Notes (Signed)
Unable able to get weight, pt stated he can not put weight on his left knee.

## 2015-06-25 NOTE — ED Provider Notes (Signed)
History  This chart was scribed for non-physician practitioner, Wynetta Emery, PA-C,working with Lyndal Pulley, MD, by Karle Plumber, ED Scribe. This patient was seen in room TR10C/TR10C and the patient's care was started at 8:38 PM.  Chief Complaint  Patient presents with  . Knee Injury   The history is provided by the patient and medical records. No language interpreter was used.    HPI Comments:  Aaron [name] is a 39 y.o. male who presents to the Emergency Department complaining of moderate left knee pain that began earlier today while at work. He states he stepped in a hole and he twisted the knee the wrong way. He reports taking three Tylenol PM and Advil with the last dose being 4-5 hours ago. Extension of the leg makes the pain worse. He denies any alleviating factors. He denies fever, chills, nausea, vomiting, wounds, numbness, tingling or weakness of the LLE.  Patient denies suicidal ideation, homicidal ideation, auditory or visual hallucinations.  Past Medical History  Diagnosis Date  . Cystic acne    Past Surgical History  Procedure Laterality Date  . Esophagogastroduodenoscopy N/A 05/17/2014    Procedure: ESOPHAGOGASTRODUODENOSCOPY (EGD);  Surgeon: Theda Belfast, MD;  [facility];  Service: Endoscopy;  Laterality: N/A;   No family history on file. History  Substance Use Topics  . Smoking status: Former Games developer  . Smokeless tobacco: Former Neurosurgeon     Comment: quit 3wks ago  . Alcohol Use: 3.0 oz/week    6 drink(s) per week     Comment: socially    Review of Systems A complete 10 system review of systems was obtained and all systems are negative except as noted in the HPI and PMH.   Allergies  Review of patient's allergies indicates no known allergies.  Home Medications   Prior to Admission medications   Medication Sig Start Date End Date Taking? Authorizing Provider  metroNIDAZOLE (FLAGYL) 500 MG tablet Take 1 tablet (500 mg total) by mouth 3  (three) times daily. 05/19/14   Rhetta Mura, MD  minocycline (MINOCIN,DYNACIN) 50 MG capsule Take 1 capsule (50 mg total) by mouth 2 (two) times daily. 05/19/14   Rhetta Mura, MD  valACYclovir (VALTREX) 1000 MG tablet Take 2,000 mg by mouth 2 (two) times daily as needed (cold sores).    Historical Provider, MD  valACYclovir (VALTREX) 1000 MG tablet TAKE 2 TABLETS BY MOUTH TWICE DAILY 12/23/14   Ronnald Nian, MD   Triage Vitals: BP 131/86 mmHg  Pulse 74  Temp(Src) 97.7 F (36.5 C) (Oral)  Resp 22  SpO2 100% Physical Exam  Constitutional: He is oriented to person, place, and time. He appears well-developed and well-nourished.  HENT:  Head: Normocephalic and atraumatic.  Eyes: EOM are normal.  Neck: Normal range of motion.  Cardiovascular: Normal rate.   Pulmonary/Chest: Effort normal.  Musculoskeletal: Normal range of motion. He exhibits tenderness.       Legs: Left knee:  No deformity, erythema or abrasions. FROM. No effusion or crepitance. Anterior and posterior drawer show no abnormal laxity. Stable to valgus and varus stress. Joint lines are non-tender. Neurovascularly intact. Pt ambulates with non-antalgic gait.    Neurological: He is alert and oriented to person, place, and time.  Alert, oriented 3.  Follows commands, Clear, goal oriented speech, Strength is 5 out of 5x4 extremities, patient ambulates with a coordinated in nonantalgic gait. Sensation is grossly intact.   Skin: Skin is warm and dry.  Psychiatric: He has a normal mood  and affect. His behavior is normal.  Nursing note and vitals reviewed.   ED Course  Procedures (including critical care time) DIAGNOSTIC STUDIES: Oxygen Saturation is 100% on RA, normal by my interpretation.   COORDINATION OF CARE: 8:42 PM- Will order injection of pain medication and crutches prior to discharge and refer to orthopedist. Pt verbalizes understanding and agrees to plan.  Medications - No data to display  Labs  Review Labs Reviewed - No data to display  Imaging Review Dg Knee Complete 4 Views Left  06/25/2015   CLINICAL DATA:  Jumped over a fence and fell landing on left knee with pain anteriorly and medially.  EXAM: LEFT KNEE - COMPLETE 4+ VIEW  COMPARISON:  None.  FINDINGS: There is no evidence of fracture, dislocation, or joint effusion. There is no evidence of arthropathy or other focal bone abnormality. Soft tissues are unremarkable.  IMPRESSION: Negative.   Electronically Signed   By: Elberta Fortis M.D.   On: 06/25/2015 20:06     EKG Interpretation None      MDM   Final diagnoses:  Knee sprain, left, initial encounter    Filed Vitals:   06/25/15 1903 06/25/15 2135  BP: 131/86 130/96  Pulse: 74 80  Temp: 97.7 F (36.5 C) 98.3 F (36.8 C)  TempSrc: Oral Oral  Resp: 22 20  SpO2: 100% 100%    Medications  ketorolac (TORADOL) 30 MG/ML injection 30 mg (30 mg Intramuscular Given 06/25/15 2055)    Aaron [name] is a pleasant 39 y.o. male presenting with pain after slipped at work today. Patient is behaving bizarrely but I think he is mildly intoxicated from the Tylenol PM that he took earlier in the day. No SI, HI, auditory hallucinations or indication for emergent psychiatric evaluation. Patient will be given crutches, knee immobilizer and orthopedic referral.  Evaluation does not show pathology that would require ongoing emergent intervention or inpatient treatment. Pt is hemodynamically stable and mentating appropriately. Discussed findings and plan with patient/guardian, who agrees with care plan. All questions answered. Return precautions discussed and outpatient follow up given.   Discharge Medication List as of 06/25/2015  9:26 PM    START taking these medications   Details  meloxicam (MOBIC) 7.5 MG tablet Take 1 tablet (7.5 mg total) by mouth daily., Starting 06/25/2015, Until Discontinued, Print        I personally performed the services described in this documentation,  which was scribed in my presence. The recorded information has been reviewed and is accurate.    Wynetta Emery, PA-C 06/25/15 2254  Lyndal Pulley, MD 06/26/15 [phone]

## 2015-06-25 NOTE — ED Notes (Signed)
Pt states he is missing his hat, pt states hat is yellow and EMT Amy went to look but no hat was found. Pt then states hat is red, this RN went to look for hat but no hat was found. Tech first noted pt was not wearing a hat.

## 2016-01-25 IMAGING — DX DG KNEE COMPLETE 4+V*L*
4 series · 4 of 4 positions shown · non-contrast
Comparison: None.

CLINICAL DATA: Jumped over a fence and fell landing on left knee
with pain anteriorly and medially.

EXAM:
LEFT KNEE - COMPLETE 4+ VIEW

[t knee ap left]
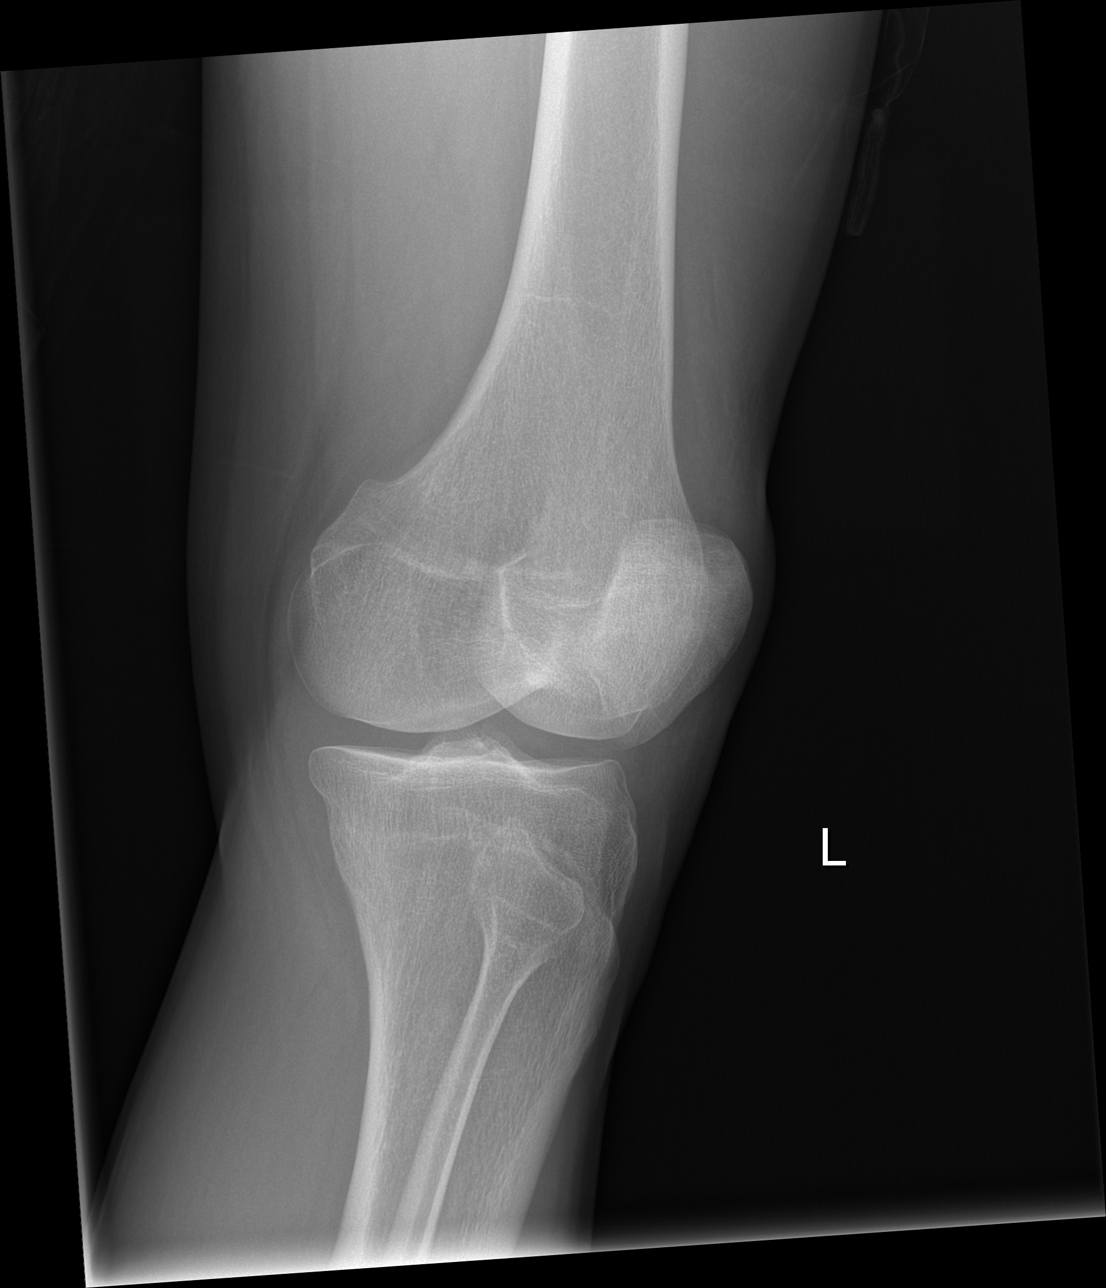

[t knee obl left (1 of 2)]
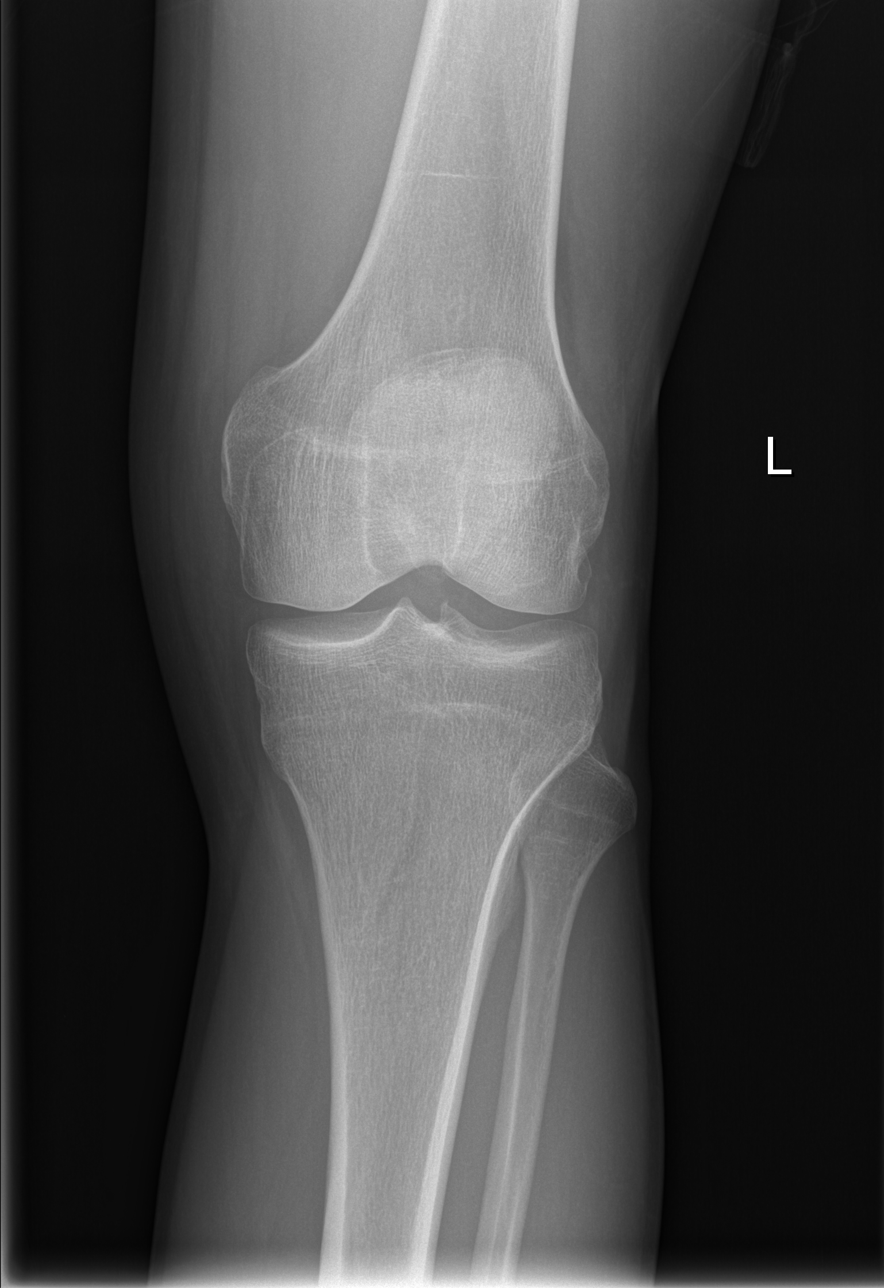

[t knee obl left (2 of 2)]
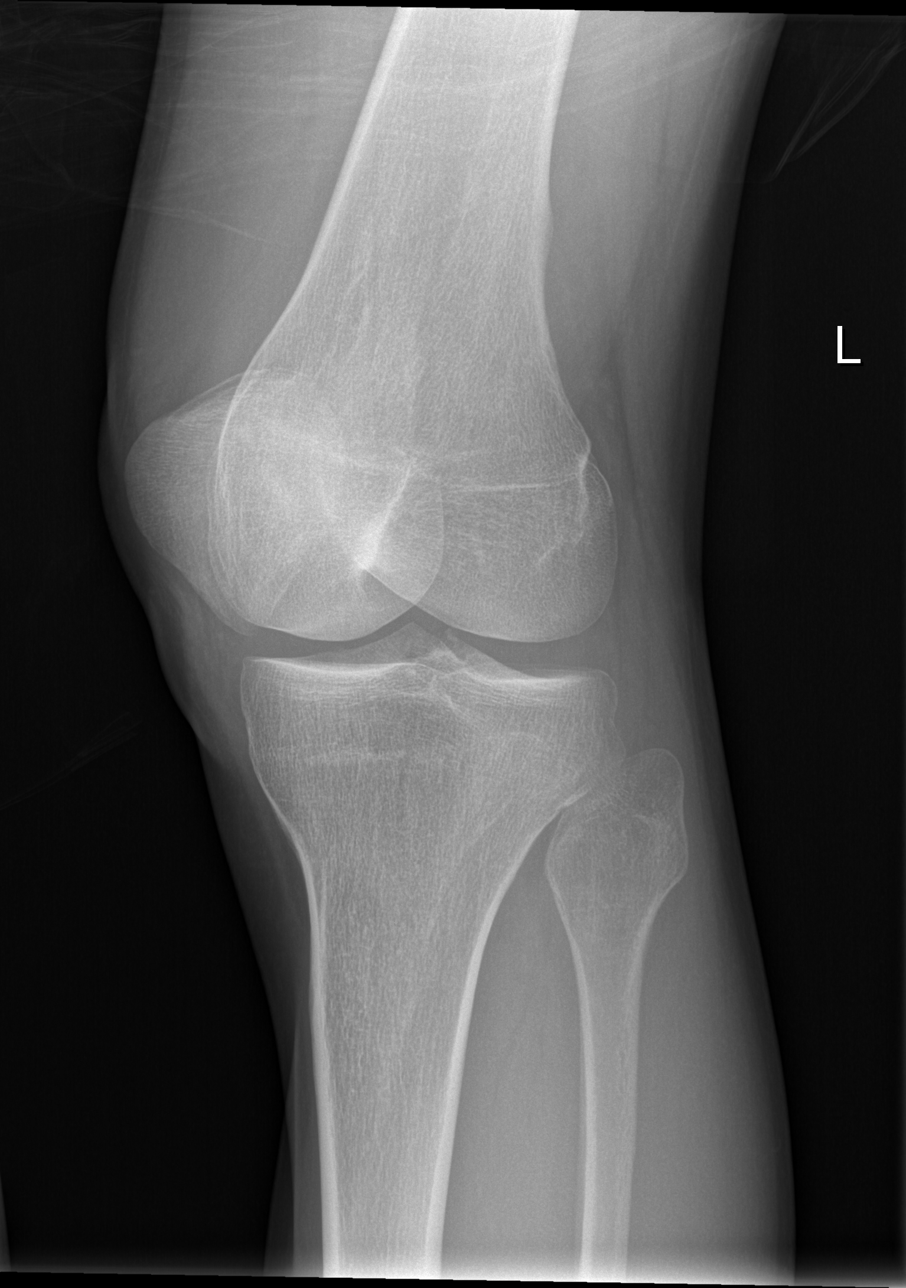

[t knee lat left]
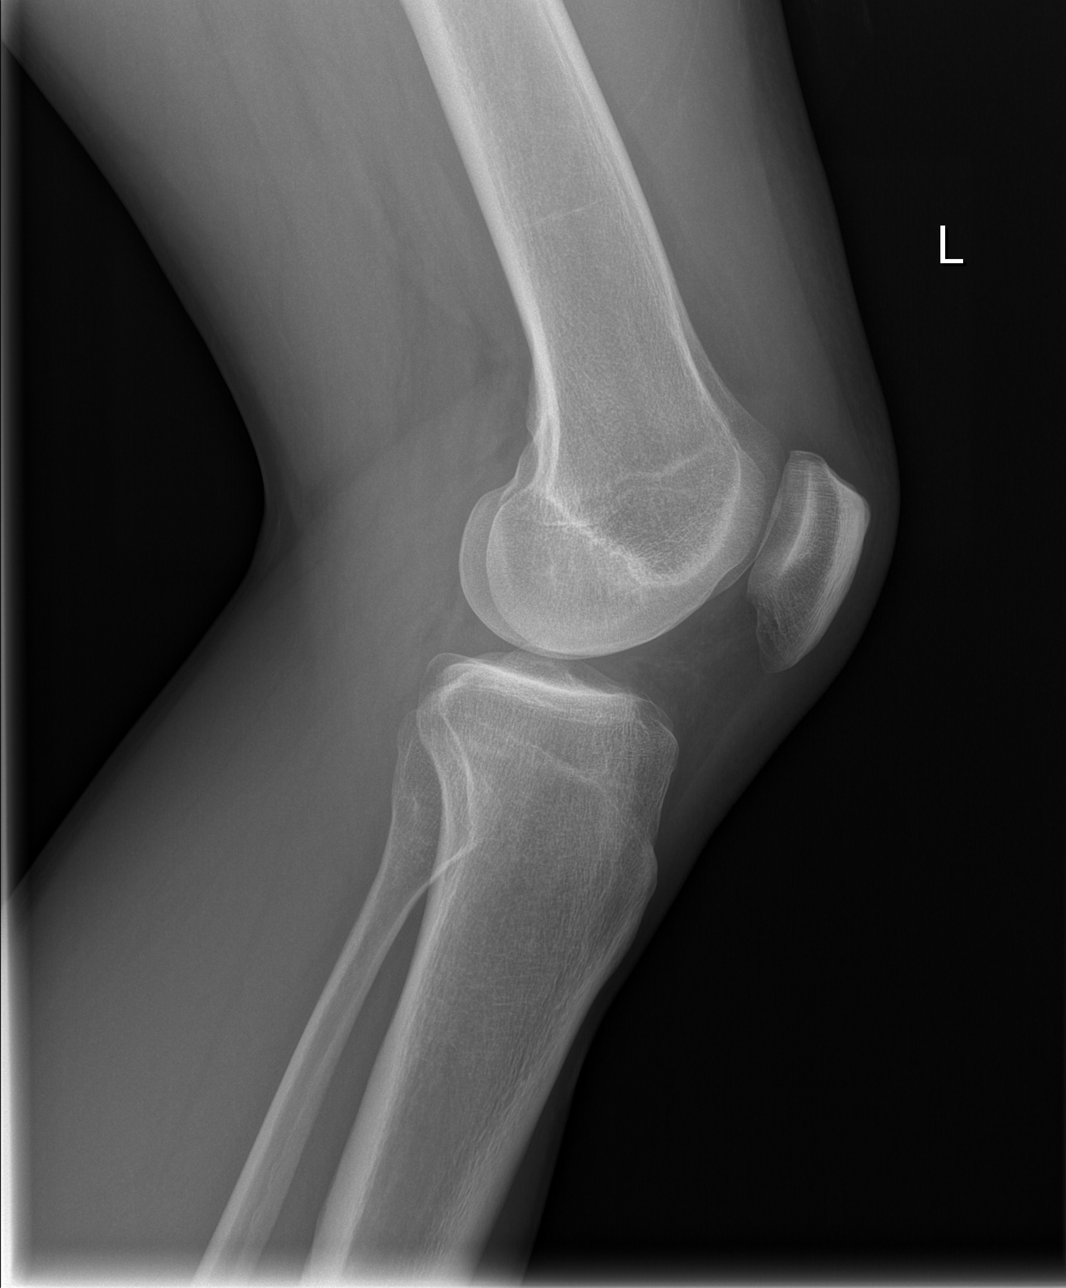

[4 of 4 positions shown; findings below may reference images not displayed]

FINDINGS: There is no evidence of fracture, dislocation, or joint effusion.
There is no evidence of arthropathy or other focal bone abnormality.
Soft tissues are unremarkable.
IMPRESSION: Negative.

## 2017-02-23 ENCOUNTER — Encounter (HOSPITAL_COMMUNITY): Payer: Self-pay | Admitting: *Deleted

## 2017-02-23 ENCOUNTER — Emergency Department (HOSPITAL_COMMUNITY)
Admission: EM | Admit: 2017-02-23 | Discharge: 2017-03-22 | Disposition: E | Payer: Self-pay | Attending: Emergency Medicine | Admitting: Emergency Medicine

## 2017-02-23 DIAGNOSIS — S069X9A Unspecified intracranial injury with loss of consciousness of unspecified duration, initial encounter: Secondary | ICD-10-CM | POA: Insufficient documentation

## 2017-02-23 DIAGNOSIS — Y929 Unspecified place or not applicable: Secondary | ICD-10-CM | POA: Insufficient documentation

## 2017-02-23 DIAGNOSIS — Y999 Unspecified external cause status: Secondary | ICD-10-CM | POA: Insufficient documentation

## 2017-02-23 DIAGNOSIS — X732XXA Intentional self-harm by machine gun discharge, initial encounter: Secondary | ICD-10-CM | POA: Insufficient documentation

## 2017-02-23 DIAGNOSIS — W3400XA Accidental discharge from unspecified firearms or gun, initial encounter: Secondary | ICD-10-CM

## 2017-02-23 DIAGNOSIS — Y939 Activity, unspecified: Secondary | ICD-10-CM | POA: Insufficient documentation

## 2017-02-23 LAB — ABO/RH: ABO/RH(D): O NEG

## 2017-02-23 LAB — I-STAT CHEM 8, ED
BUN: 7 mg/dL (ref 6–20)
CREATININE: 1.2 mg/dL (ref 0.61–1.24)
Calcium, Ion: 1.21 mmol/L (ref 1.15–1.40)
Chloride: 109 mmol/L (ref 101–111)
GLUCOSE: 134 mg/dL — AB (ref 65–99)
HCT: 35 % — ABNORMAL LOW (ref 39.0–52.0)
HEMOGLOBIN: 11.9 g/dL — AB (ref 13.0–17.0)
Potassium: 4.2 mmol/L (ref 3.5–5.1)
Sodium: 141 mmol/L (ref 135–145)
TCO2: 20 mmol/L (ref 0–100)

## 2017-02-23 LAB — I-STAT CG4 LACTIC ACID, ED: LACTIC ACID, VENOUS: 8.23 mmol/L — AB (ref 0.5–1.9)

## 2017-02-23 MED ORDER — EPINEPHRINE PF 1 MG/10ML IJ SOSY
PREFILLED_SYRINGE | INTRAMUSCULAR | Status: AC | PRN
  Administered 2017-02-23: 1 via INTRAVENOUS

## 2017-02-23 MED ORDER — SODIUM CHLORIDE 0.9 % IV SOLN
INTRAVENOUS | Status: DC | PRN
  Administered 2017-02-23 (×3): 1000 mL via INTRAVENOUS

## 2017-02-23 MED ORDER — EPINEPHRINE PF 1 MG/ML IJ SOLN
0.5000 ug/min | INTRAVENOUS | Status: DC
  Filled 2017-02-23: qty 4
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
## 2017-02-23 NOTE — Code Documentation (Signed)
3rd unit blood.

## 2017-02-23 NOTE — ED Notes (Signed)
1st unit Blood started

## 2017-02-23 NOTE — Code Documentation (Signed)
3rd rbc complted.  3rd ffp complete.

## 2017-02-23 NOTE — ED Notes (Signed)
2nd ffp started

## 2017-02-23 NOTE — Code Documentation (Signed)
4th ffp started 

## 2017-02-23 NOTE — ED Notes (Signed)
TOD
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
## 2017-02-23 NOTE — ED Notes (Signed)
4th  ffp and 4th L blood complete.  Dr Lindie Spruce states no more blood products at this time.

## 2017-02-23 NOTE — Code Documentation (Signed)
4th unit rbc started.

## 2017-02-23 NOTE — ED Notes (Signed)
3rd RBC and 3rd ffp started

## 2017-02-23 NOTE — Code Documentation (Signed)
+   Femoral pulse noted

## 2017-02-23 NOTE — ED Notes (Addendum)
Compressions started.

## 2017-02-23 NOTE — ED Notes (Addendum)
No pulse noted.  No manual bp.  Pt presently being trached.  Will begin compressions when procedure complete.

## 2017-02-23 NOTE — ED Notes (Signed)
1st unit complete.  2nd unit blood starting.

## 2017-02-23 NOTE — ED Notes (Addendum)
Unable to find manual bp. Warm ns started on [place].

## 2017-02-23 NOTE — ED Notes (Signed)
4th rbc started

## 2017-02-23 NOTE — ED Notes (Signed)
4th ffp started

## 2017-02-23 NOTE — ED Notes (Signed)
FFP started

## 2017-02-23 NOTE — ED Notes (Signed)
cpr stopped. V-fib.  Shocked.
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
## 2017-02-23 NOTE — ED Notes (Signed)
2nd ffp completed.  2L warmed saline started.

## 2017-02-23 NOTE — Code Documentation (Signed)
3rd L ns started while waiting for blood from blood bank.

## 2017-02-23 NOTE — ED Notes (Signed)
Epi started.  No palpable pulse.  HR 29.

## 2017-02-24 ENCOUNTER — Encounter (HOSPITAL_COMMUNITY): Payer: Self-pay | Admitting: *Deleted

## 2017-02-24 LAB — BPAM FFP
BLOOD PRODUCT EXPIRATION DATE: 201804222359
BLOOD PRODUCT EXPIRATION DATE: 201804242359
Blood Product Expiration Date: 201804222359
Blood Product Expiration Date: 201804252359
ISSUE DATE / TIME: 201804041545
ISSUE DATE / TIME: 201804041559
ISSUE DATE / TIME: 201804041545
ISSUE DATE / TIME: 201804041559
UNIT TYPE AND RH: 6200
UNIT TYPE AND RH: 6200
UNIT TYPE AND RH: 6200
Unit Type and Rh: 6200

## 2017-02-24 LAB — PREPARE FRESH FROZEN PLASMA
UNIT DIVISION: 0
Unit division: 0
Unit division: 0
Unit division: 0

## 2017-02-24 LAB — BLOOD PRODUCT ORDER (VERBAL) VERIFICATION

## 2017-02-25 LAB — TYPE AND SCREEN
ABO/RH(D): O NEG
Antibody Screen: NEGATIVE
UNIT DIVISION: 0
UNIT DIVISION: 0
UNIT DIVISION: 0
UNIT DIVISION: 0
Unit division: 0
Unit division: 0
Unit division: 0
Unit division: 0

## 2017-02-25 LAB — BPAM RBC
BLOOD PRODUCT EXPIRATION DATE: 201804102359
BLOOD PRODUCT EXPIRATION DATE: 201804112359
BLOOD PRODUCT EXPIRATION DATE: 201804132359
BLOOD PRODUCT EXPIRATION DATE: 201804132359
BLOOD PRODUCT EXPIRATION DATE: 201804262359
Blood Product Expiration Date: 201804262359
Blood Product Expiration Date: 201804262359
Blood Product Expiration Date: 201804262359
ISSUE DATE / TIME: 201804041559
ISSUE DATE / TIME: 201804041603
ISSUE DATE / TIME: 201804041544
ISSUE DATE / TIME: 201804041544
ISSUE DATE / TIME: 201804041559
ISSUE DATE / TIME: 201804042014
ISSUE DATE / TIME: 201804060350
ISSUE DATE / TIME: 201804060350
UNIT TYPE AND RH: 9500
UNIT TYPE AND RH: 9500
UNIT TYPE AND RH: 9500
Unit Type and Rh: 9500
Unit Type and Rh: 9500
Unit Type and Rh: 9500
Unit Type and Rh: 9500
Unit Type and Rh: 9500

## 2017-03-22 DEATH — deceased
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
